# Patient Record
Sex: Female | Born: 1955 | Hispanic: Yes | Smoking: Never smoker
Health system: Southern US, Community
[De-identification: ages and names within clinical notes are randomized; demographics above are authoritative.]

## PROBLEM LIST (undated history)

## (undated) DIAGNOSIS — M797 Fibromyalgia: Secondary | ICD-10-CM

## (undated) DIAGNOSIS — T884XXA Failed or difficult intubation, initial encounter: Secondary | ICD-10-CM

## (undated) DIAGNOSIS — E785 Hyperlipidemia, unspecified: Secondary | ICD-10-CM

## (undated) DIAGNOSIS — K219 Gastro-esophageal reflux disease without esophagitis: Secondary | ICD-10-CM

## (undated) DIAGNOSIS — M1990 Unspecified osteoarthritis, unspecified site: Secondary | ICD-10-CM

## (undated) DIAGNOSIS — D179 Benign lipomatous neoplasm, unspecified: Secondary | ICD-10-CM

## (undated) DIAGNOSIS — I639 Cerebral infarction, unspecified: Secondary | ICD-10-CM

## (undated) DIAGNOSIS — J45909 Unspecified asthma, uncomplicated: Secondary | ICD-10-CM

## (undated) DIAGNOSIS — I10 Essential (primary) hypertension: Secondary | ICD-10-CM

## (undated) HISTORY — PX: ABDOMINAL HYSTERECTOMY: SHX81

## (undated) HISTORY — PX: BREAST EXCISIONAL BIOPSY: SUR124

---

## 2011-04-26 HISTORY — PX: BREAST EXCISIONAL BIOPSY: SUR124

## 2011-04-26 HISTORY — PX: BREAST BIOPSY: SHX20

## 2015-01-18 ENCOUNTER — Encounter (HOSPITAL_BASED_OUTPATIENT_CLINIC_OR_DEPARTMENT_OTHER): Payer: Self-pay | Admitting: *Deleted

## 2015-01-18 ENCOUNTER — Emergency Department (HOSPITAL_BASED_OUTPATIENT_CLINIC_OR_DEPARTMENT_OTHER)
Admission: EM | Admit: 2015-01-18 | Discharge: 2015-01-18 | Disposition: A | Discharge status: Home / Self Care | Payer: Medicare HMO | Attending: Emergency Medicine | Admitting: Emergency Medicine

## 2015-01-18 DIAGNOSIS — Z880 Allergy status to penicillin: Secondary | ICD-10-CM | POA: Diagnosis not present

## 2015-01-18 DIAGNOSIS — X58XXXA Exposure to other specified factors, initial encounter: Secondary | ICD-10-CM | POA: Diagnosis not present

## 2015-01-18 DIAGNOSIS — Y9289 Other specified places as the place of occurrence of the external cause: Secondary | ICD-10-CM | POA: Diagnosis not present

## 2015-01-18 DIAGNOSIS — H9202 Otalgia, left ear: Secondary | ICD-10-CM | POA: Diagnosis not present

## 2015-01-18 DIAGNOSIS — Y9389 Activity, other specified: Secondary | ICD-10-CM | POA: Diagnosis not present

## 2015-01-18 DIAGNOSIS — T162XXA Foreign body in left ear, initial encounter: Secondary | ICD-10-CM | POA: Diagnosis present

## 2015-01-18 DIAGNOSIS — Y998 Other external cause status: Secondary | ICD-10-CM | POA: Insufficient documentation

## 2015-01-18 HISTORY — DX: Fibromyalgia: M79.7

## 2015-01-18 MED ORDER — CIPROFLOXACIN-DEXAMETHASONE 0.3-0.1 % OT SUSP: 4.0000 [drp] | Freq: Two times a day (BID) | OTIC | Status: DC

## 2015-01-18 NOTE — ED Notes (Signed)
EDPA in room, at [facility]. Pt seen by EDPA prior to RN assessment, see PA notes, orders received to d/c. Care assumed at time of d/c. Report received from [facility], RN.

## 2015-01-18 NOTE — ED Notes (Signed)
?  cotton in left ear x 1 day

## 2015-01-18 NOTE — Discharge Instructions (Signed)
Please read and follow all provided instructions.  Your diagnoses today include:  1. Ear pain, left     Tests performed today include:  Vital signs. See below for your results today.   Medications prescribed:  Ciprofloxacin - antibiotic  You have been prescribed an antibiotic medicine for the ear.  Home care instructions:  Follow any educational materials contained in this packet.  Follow-up instructions: Please follow-up with your primary care provider as needed for further evaluation of your symptoms.  Return instructions:   Please return to the Emergency Department if you experience worsening symptoms.   Please return if you have any other emergent concerns.  Additional Information:  Your vital signs today were: BP 124/71 mmHg   Pulse 59   Temp(Src) 97.6 F (36.4 C) (Oral)   Resp 18   Ht  (1.6 m)   Wt 142 lb (64.411 kg)   BMI 25.16 kg/m2   SpO2 100% If your blood pressure (BP) was elevated above 135/85 this visit, please have this repeated by your doctor within one month. --------------- Otalgia (Otalgia) Usted o su nio sienten dolor en el odo. La causa ms frecuente es la infeccin del odo medio. El dolor aparece debido a la acumulacin de lquido y a la presin detrs del tmpano. El dolor puede ser agudo, sordo o intenso. Puede ser transitorio o constante. El odo medio est conectado a los conductos nasales por un corto tubo denominado trompa de [place]. Las trompas de [employer] permiten que el lquido drene [place] afuera del odo medio y [place] a Pharmacologist equilibrada la presin del odo . CAUSAS Un resfro o alergia pueden bloquear las trompas de [place] debido a la inflamacin y a Haematologist de Administrator. Esto es especialmente probable en los nios pequeos debido a que sus trompas son ms cortas y se Ambulance person posicin ms horizontal. Cuando las trompas de [place] se obstruyen, se detiene el flujo normal de lquido que proviene del odo. El  lquido se acumula y causa rigidez, Engineer, mining, prdida [place] e infeccin si desarrollan grmenes. SNTOMAS Los sntomas de infeccin en el odo son [place] Ruts, dolor, nerviosismo, aumento del llanto e irritabilidad. Muchos nios sufrirn una prdida [place] menor y temporaria durante la infeccin e inmediatamente despus. La prdida [employer] no es frecuente pero el riesgo aumenta si el nio sufre muchas infecciones. Otra de las causas es la retencin de agua en el canal auditivo externo debido a la natacin o al bao. En los adultos es menos probable que la causa del dolor sea una infeccin. El dolor de odos puede provenir de otras zonas. En algunos casos existe un problema en la articulacin que se encuentra entre la [place] y el crneo. Tambin puede provenir de los dientes o el cuello. Otras causas son:  Cuerpo extrao en el odo.  Infeccin en el odo externo.  Sinusitis.  Tapn de cera.  Traumatismo.  Artritis de Architectural technologist o trastornos de la articulacin temporomandibular.  Infeccin en el odo medio  Infecciones dentales.  Dolor de garganta con dolor de odos. DIAGNSTICO Generalmente el profesional realiza el diagnstico a travs de Nurse, learning disability. En algunos casos ser necesario realizar estudios especiales, como radiografas o [place]. TRATAMIENTO  Si le han prescripto antibiticos, selos segn las indicaciones y tmelos hasta completarlos an si los sntomas parecen haber mejorado.  En algunos nios ser necesario colocar tubos de ecualizacin de presin. stos tubos son pequeos conductos plsticos que se colocan en el tmpano en un procedimiento quirrgico simple.  Ellos permiten que el lquido drene ms fcilmente y Regulatory affairs officer presin del odo medio. Como consecuencia se Printmaker dolor ocasionado por los cambios de presin. INSTRUCCIONES PARA EL CUIDADO DOMICILIARIO  Utilice los medicamentos de venta libre o de prescripcin para Chief Technology Officer, el malestar o la  [place], segn se lo indique el profesional que lo asiste. NO LE ADMINISTRE ASPIRINA A SU NIO porque existe el riesgo de contraer el [place].  Aplique compresas frias en el odo externo durante 15 a 20 minutos, 3 a 4 veces por da, o segn lo necesite para Engineer, materials. No aplique hielo directamente sobre la piel. Puede congelar la piel.  Las gotas de venta libre utilizadas segn las indicaciones pueden ser efectivas. En algunos Nurse, adult.  Descansar en posicin erguida puede ayudar a reducir la presin en el odo medio y a Engineer, materials.  El dolor de odos causado por un rpido descenso desde altitudes elevadas puede aliviarse tragando o [employer]. Haga que el beb succione la mamadera durante los viajes en avin.  No fume dentro de la casa o cerca de los nios. Si no puede abandonar el hbito, fume en el exterior.  Controle las [place]. SOLICITE ATENCIN MDICA DE INMEDIATO SI:  Usted o el nio se sienten enfermos.  No consigue Engineer, materials con la medicacin.  Sus sntomas o los del nio (dolor, fiebre o irritabilidad) no mejoran dentro de 24 a 48 hs o segn las indicaciones.  Siente un dolor intenso y se detiene bruscamente. Esto puede indicar la ruptura del tmpano.  Usted o el nio desarrollan nuevos problemas, como dolor de cabeza intenso, rigidez en el cuello, dificultad para tragar o hinchazn del rostro o en la zona que rodea el odo. Document Released: 07/19/2007 Document Revised: 07/04/2011 [facility] Patient Information 2015 [place], [place]. This information is not intended to replace advice given to you by your health care provider. Make sure you discuss any questions you have with your health care provider.

## 2015-01-18 NOTE — ED Provider Notes (Signed)
CSN: [id number]     Arrival date & time 01/18/15  1826 History   First MD Initiated Contact with Patient 01/18/15 1833     Chief Complaint  Patient presents with  . Foreign Body in Ear     (Consider location/radiation/quality/duration/timing/severity/associated sxs/prior Treatment) HPI Comments: Patient presents with complaint of left ear irritation for the past 1 day. Patient was using Q-tips to clean her ear. She does this up to twice a day. She is concerned that she has a foreign body in her here due to the sensation of fullness. No hearing change. No discharge from the ear. No fevers, swelling or pain around the ear. She denies other injury. Onset acute. Course is constant.  Patient is a 59 y.o. female presenting with foreign body in ear. The history is provided by the patient.  Foreign Body in Ear Pertinent negatives include no abdominal pain, chills, congestion, coughing, fatigue, fever, headaches, myalgias, nausea, rash, sore throat or vomiting.    Past Medical History  Diagnosis Date  . Fibromyalgia    Past Surgical History  Procedure Laterality Date  . Abdominal hysterectomy     No family history on file. Social History  Substance Use Topics  . Smoking status: Never Smoker   . Smokeless tobacco: Never Used  . Alcohol Use: No   OB History    No data available     Review of Systems  Constitutional: Negative for fever, chills and fatigue.  HENT: Positive for ear pain. Negative for congestion, ear discharge, rhinorrhea, sinus pressure and sore throat.   Eyes: Negative for redness.  Respiratory: Negative for cough and wheezing.   Gastrointestinal: Negative for nausea, vomiting, abdominal pain and diarrhea.  Genitourinary: Negative for dysuria.  Musculoskeletal: Negative for myalgias and neck stiffness.  Skin: Negative for rash.  Neurological: Negative for headaches.  Hematological: Negative for adenopathy.      Allergies  Amoxicillin  Home Medications    Prior to Admission medications   Medication Sig Start Date End Date Taking? Authorizing Provider  ciprofloxacin-dexamethasone (CIPRODEX) otic suspension Place 4 drops into the left ear 2 (two) times daily. 01/18/15   Renne Crigler, PA-C   BP 124/71 mmHg  Pulse 59  Temp(Src) 97.6 F (36.4 C) (Oral)  Resp 18  Ht  (1.6 m)  Wt 142 lb (64.411 kg)  BMI 25.16 kg/m2  SpO2 100% Physical Exam  Constitutional: She appears well-developed and well-nourished.  HENT:  Head: Normocephalic and atraumatic.  Right Ear: Tympanic membrane and ear canal normal. No drainage, swelling or tenderness. No foreign bodies. Tympanic membrane is not injected, not scarred, not perforated, not erythematous, not retracted and not bulging. No middle ear effusion. No decreased hearing is noted.  Left Ear: Tympanic membrane normal. There is tenderness (mild with insertion of speculum). No drainage or swelling. No foreign bodies. Tympanic membrane is not injected, not scarred, not perforated, not erythematous, not retracted and not bulging.  No middle ear effusion. No decreased hearing is noted.  Nose: Nose normal. No mucosal edema or rhinorrhea.  Mouth/Throat: Uvula is midline, oropharynx is clear and moist and mucous membranes are normal. Mucous membranes are not dry. No oral lesions. No trismus in the jaw. No uvula swelling. No oropharyngeal exudate, posterior oropharyngeal edema, posterior oropharyngeal erythema or tonsillar abscesses.  Eyes: Conjunctivae are normal. Right eye exhibits no discharge. Left eye exhibits no discharge.  Neck: Normal range of motion. Neck supple.  Cardiovascular: Normal rate, regular rhythm and normal heart sounds.   Pulmonary/Chest:  Effort normal and breath sounds normal. No respiratory distress. She has no wheezes. She has no rales.  Abdominal: Soft. There is no tenderness.  Lymphadenopathy:    She has no cervical adenopathy.  Neurological: She is alert.  Skin: Skin is warm and dry.   Psychiatric: She has a normal mood and affect.  Nursing note and vitals reviewed.   ED Course  Procedures (including critical care time) Labs Review Labs Reviewed - No data to display  Imaging Review No results found. I have personally reviewed and evaluated these images and lab results as part of my medical decision-making.   EKG Interpretation None       Patient seen and examined.    Vital signs reviewed and are as follows: BP 124/71 mmHg  Pulse 59  Temp(Src) 97.6 F (36.4 C) (Oral)  Resp 18  Ht  (1.6 m)  Wt 142 lb (64.411 kg)  BMI 25.16 kg/m2  SpO2 100%  Due to the inflamed ear canal, patient given Ciprodex drops to use. Reassure that there is no foreign body. Counseled to avoid placing anything into the ear canal, including Q-tips.  MDM   Final diagnoses:  Ear pain, left   Patient here with ear pain, tender mildly inflamed ear canal. Concern for possible otitis externa. Patient treated as above. No concern for malignant otitis. No TM perforation. Patient well-appearing.   Renne Crigler, PA-C 01/19/15 0000  Benjiman Core, MD 01/19/15 [phone]

## 2015-01-18 NOTE — ED Notes (Signed)
[employer] interpreter "Gerilyn Pilgrim" used for assessment, questions, d/c instructions.

## 2018-05-10 ENCOUNTER — Other Ambulatory Visit: Payer: Self-pay | Admitting: Physician Assistant

## 2018-05-10 DIAGNOSIS — Z1231 Encounter for screening mammogram for malignant neoplasm of breast: Secondary | ICD-10-CM

## 2018-06-06 ENCOUNTER — Ambulatory Visit
Admission: RE | Admit: 2018-06-06 | Discharge: 2018-06-06 | Disposition: A | Discharge status: Home / Self Care | Payer: Medicare Other | Source: Ambulatory Visit | Attending: Physician Assistant | Admitting: Physician Assistant

## 2018-06-06 DIAGNOSIS — Z1231 Encounter for screening mammogram for malignant neoplasm of breast: Secondary | ICD-10-CM | POA: Diagnosis not present

## 2018-06-12 ENCOUNTER — Other Ambulatory Visit: Payer: Self-pay | Admitting: Physician Assistant

## 2018-06-12 DIAGNOSIS — N6489 Other specified disorders of breast: Secondary | ICD-10-CM

## 2018-06-12 DIAGNOSIS — R928 Other abnormal and inconclusive findings on diagnostic imaging of breast: Secondary | ICD-10-CM

## 2018-06-14 ENCOUNTER — Other Ambulatory Visit: Payer: Medicare Other

## 2018-06-14 ENCOUNTER — Ambulatory Visit: Payer: Medicare Other

## 2018-06-19 ENCOUNTER — Other Ambulatory Visit: Payer: Medicare Other

## 2018-06-19 ENCOUNTER — Ambulatory Visit: Payer: Medicare Other

## 2018-06-20 ENCOUNTER — Ambulatory Visit
Admission: RE | Admit: 2018-06-20 | Discharge: 2018-06-20 | Disposition: A | Discharge status: Home / Self Care | Payer: Medicare Other | Source: Ambulatory Visit | Attending: Physician Assistant | Admitting: Physician Assistant

## 2018-06-20 DIAGNOSIS — N6489 Other specified disorders of breast: Secondary | ICD-10-CM | POA: Diagnosis present

## 2018-06-20 DIAGNOSIS — R928 Other abnormal and inconclusive findings on diagnostic imaging of breast: Secondary | ICD-10-CM

## 2018-06-26 ENCOUNTER — Other Ambulatory Visit: Payer: Self-pay | Admitting: Physician Assistant

## 2018-06-26 DIAGNOSIS — N6002 Solitary cyst of left breast: Secondary | ICD-10-CM

## 2019-03-19 ENCOUNTER — Ambulatory Visit
Admission: RE | Admit: 2019-03-19 | Discharge: 2019-03-19 | Disposition: A | Discharge status: Home / Self Care | Payer: Medicare Other | Source: Ambulatory Visit | Attending: Physician Assistant | Admitting: Physician Assistant

## 2019-03-19 DIAGNOSIS — N6002 Solitary cyst of left breast: Secondary | ICD-10-CM | POA: Insufficient documentation

## 2019-06-17 ENCOUNTER — Other Ambulatory Visit: Payer: Self-pay | Admitting: Physician Assistant

## 2019-06-17 DIAGNOSIS — R928 Other abnormal and inconclusive findings on diagnostic imaging of breast: Secondary | ICD-10-CM

## 2019-06-18 ENCOUNTER — Other Ambulatory Visit: Payer: Self-pay | Admitting: Internal Medicine

## 2019-06-18 DIAGNOSIS — N6320 Unspecified lump in the left breast, unspecified quadrant: Secondary | ICD-10-CM

## 2019-07-03 ENCOUNTER — Ambulatory Visit
Admission: RE | Admit: 2019-07-03 | Discharge: 2019-07-03 | Disposition: A | Discharge status: Home / Self Care | Payer: Medicare HMO | Source: Ambulatory Visit | Attending: Internal Medicine | Admitting: Internal Medicine

## 2019-07-03 DIAGNOSIS — N6320 Unspecified lump in the left breast, unspecified quadrant: Secondary | ICD-10-CM | POA: Diagnosis present

## 2019-07-03 DIAGNOSIS — N6321 Unspecified lump in the left breast, upper outer quadrant: Secondary | ICD-10-CM | POA: Diagnosis not present

## 2019-07-19 ENCOUNTER — Ambulatory Visit: Payer: Medicare HMO

## 2019-07-31 ENCOUNTER — Ambulatory Visit: Payer: Medicare HMO | Attending: Internal Medicine

## 2019-11-07 IMAGING — MG DIGITAL DIAGNOSTIC UNILATERAL LEFT MAMMOGRAM WITH TOMO AND CAD
6 series · 6 of 18 positions shown · non-contrast
Comparison: Previous exam(s).

CLINICAL DATA: Patient was called back from screening mammogram for
a possible asymmetry in the left breast.

EXAM:
DIGITAL DIAGNOSTIC LEFT MAMMOGRAM WITH CAD AND TOMO
ULTRASOUND LEFT BREAST

[L MLO synth-2D (1 of 2)]
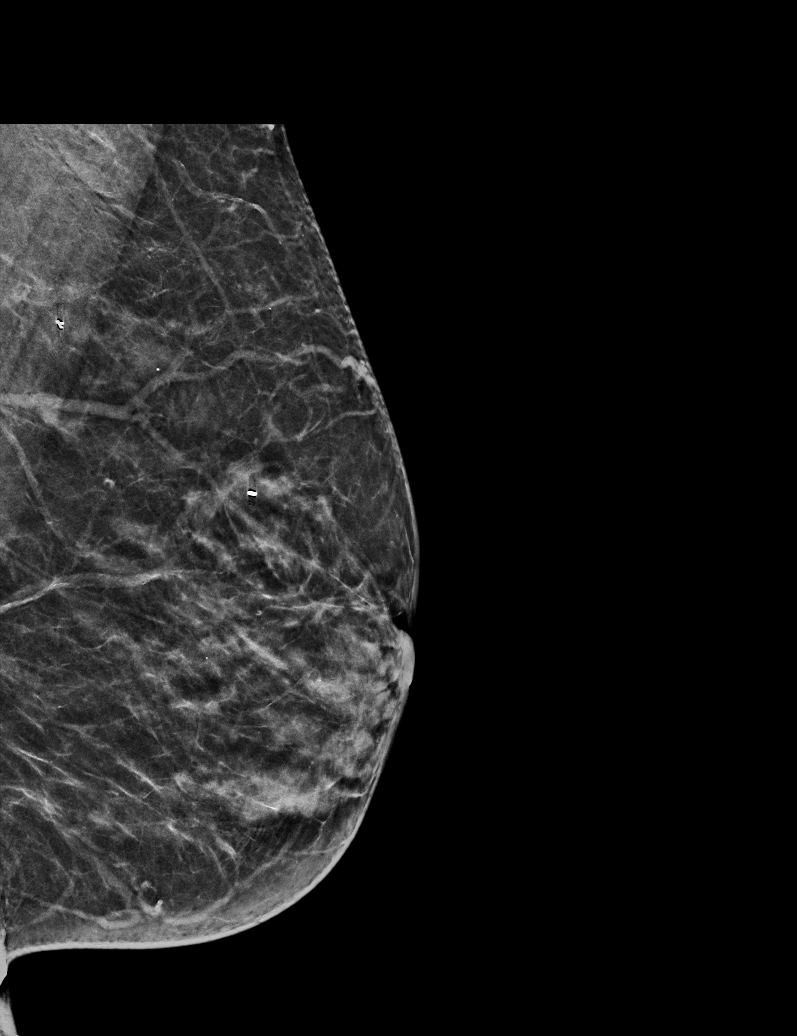

[L MLO synth-2D (2 of 2)]
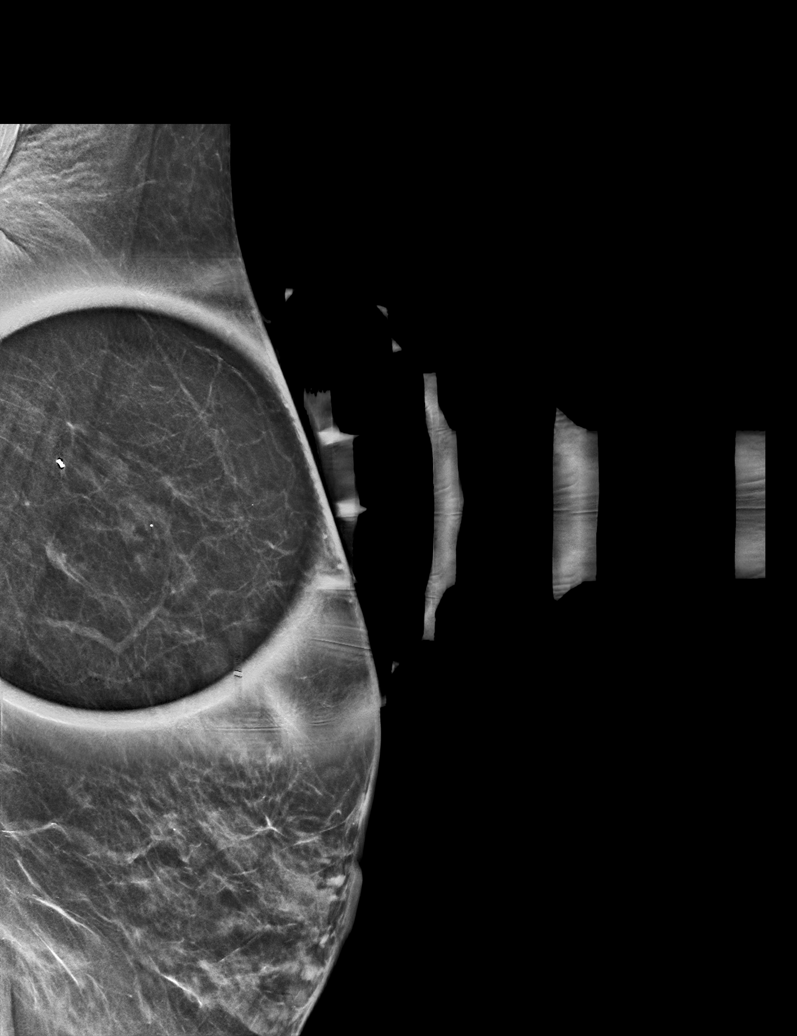

[L ML synth-2D]
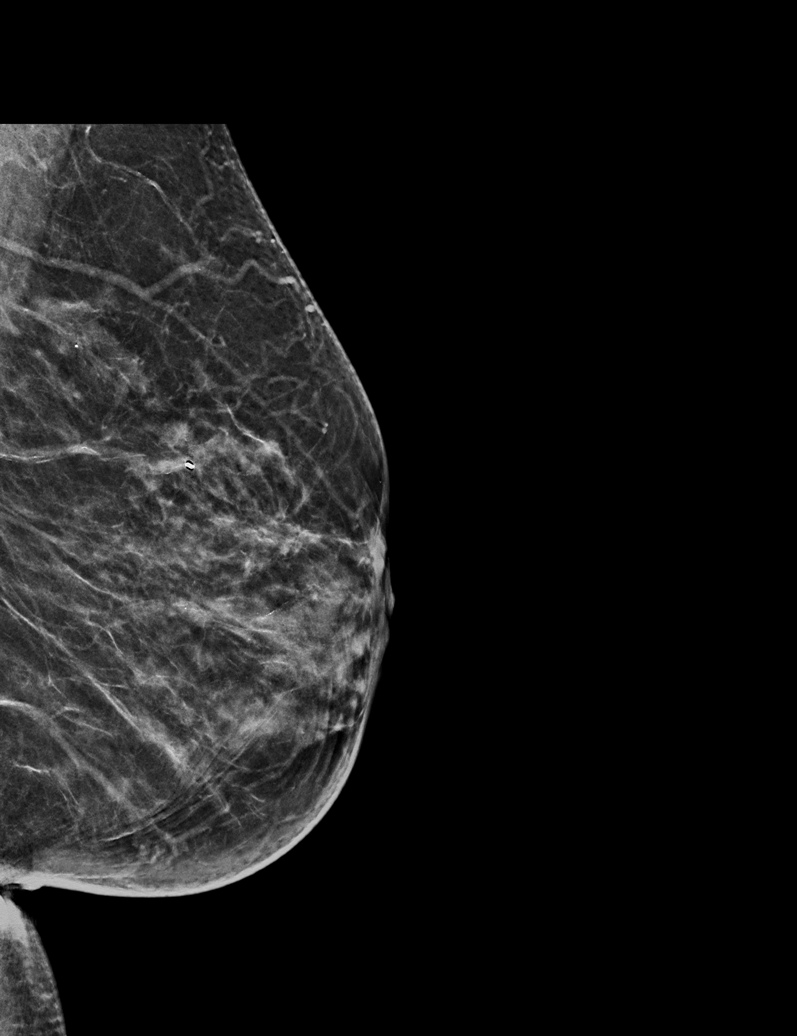

[L ML tomo · tomo slice 29/58.0]
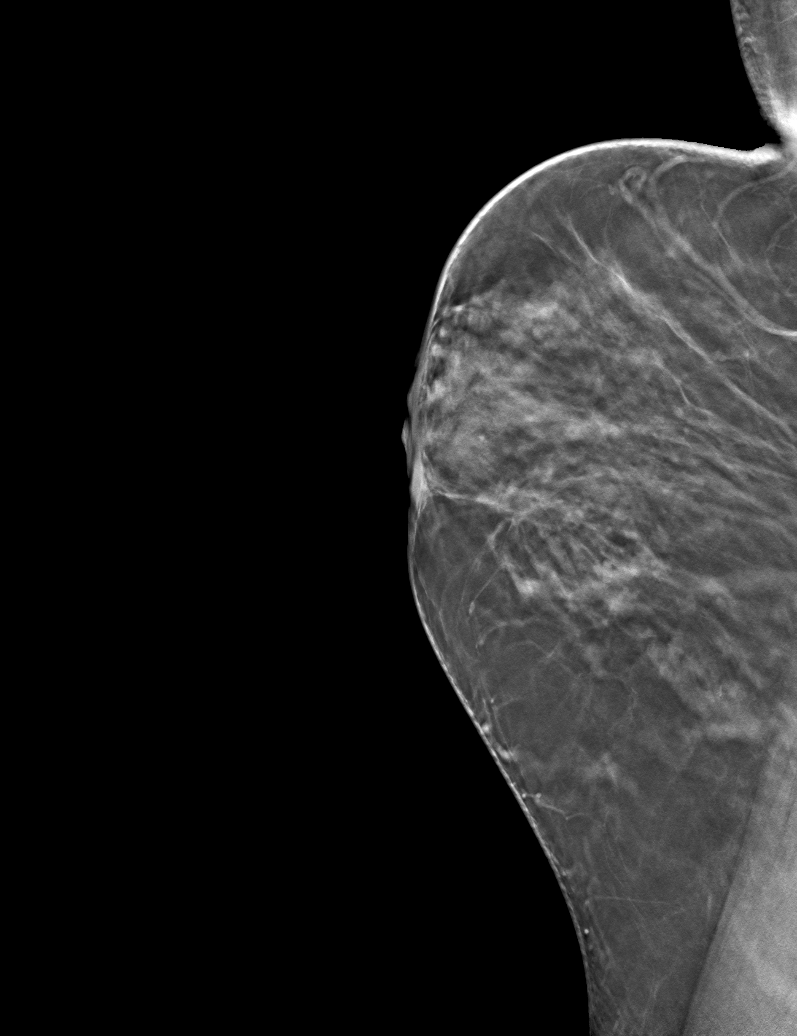

[L MLO tomo (1 of 2) · tomo slice 27/53.0]
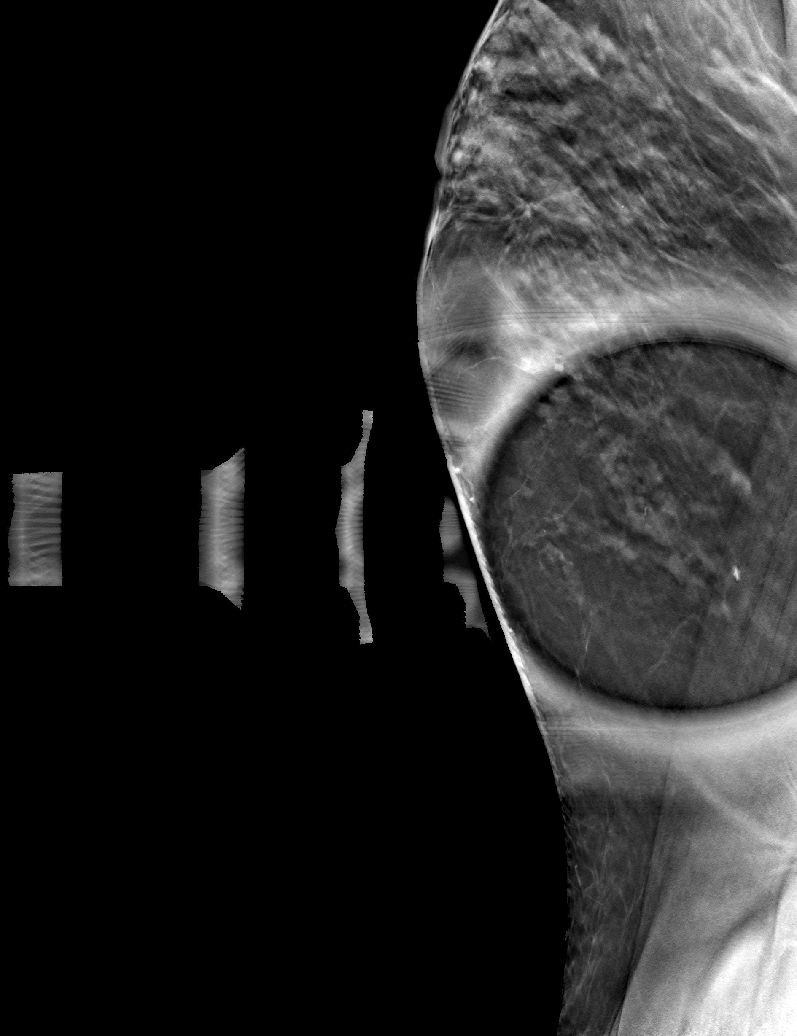

[L MLO tomo (2 of 2) · tomo slice 27/54.0]
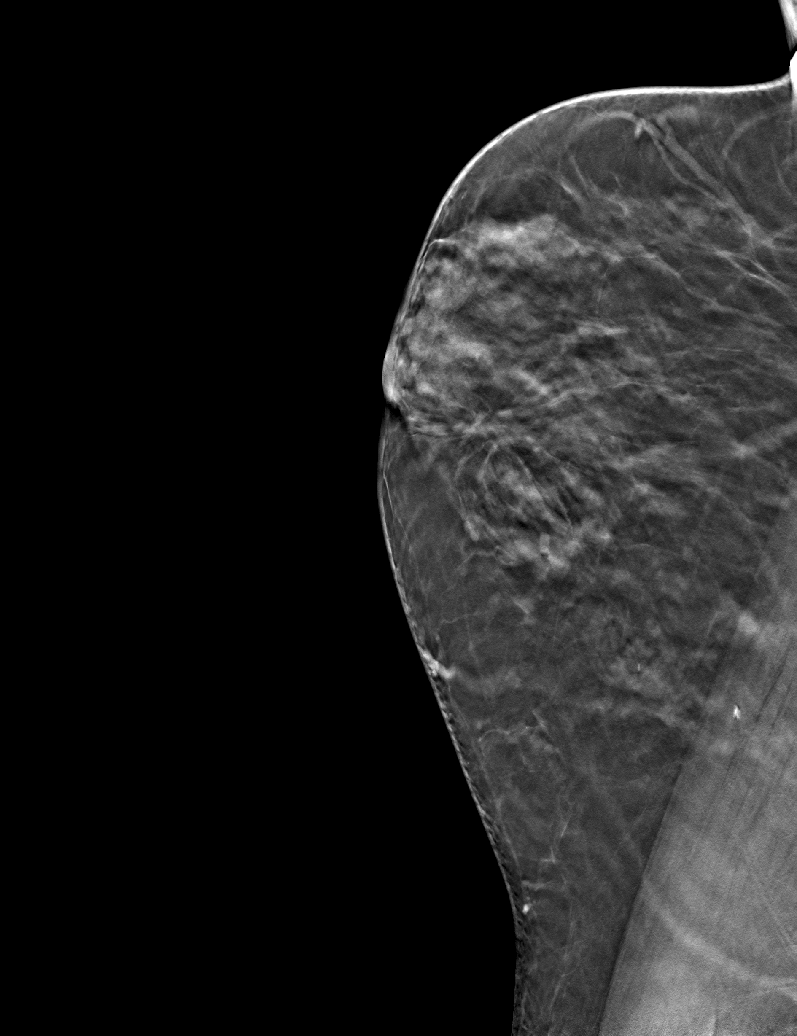

[6 of 18 positions shown; findings below may reference images not displayed]

ACR Breast Density Category c: The breast tissue is heterogeneously
dense, which may obscure small masses.
FINDINGS: Additional imaging of the left breast was performed. There is
persistence of a 7 mm mass in the posterior third of the upper outer
quadrant of the left breast.

Mammographic images were processed with CAD.

On physical exam, no mass is palpated in the upper-outer quadrant of
the left breast.

Targeted ultrasound is performed, showing probable benign cluster of
cysts (apocrine metaplasia) in the left breast at [DATE] 9 cm from the
nipple measuring 7 x 3 x 6 mm.
IMPRESSION: Probable benign cysts (apocrine metaplasia) in left breast at [DATE] 9
cm from the nipple.

RECOMMENDATION:
Short-term interval follow-up left breast ultrasound 6 months is
recommended.

I have discussed the findings and recommendations with the patient.
Results were also provided in writing at the conclusion of the
visit. If applicable, a reminder letter will be sent to the patient
regarding the next appointment.

BI-RADS CATEGORY  3: Probably benign.

## 2020-07-04 ENCOUNTER — Emergency Department
Admission: EM | Admit: 2020-07-04 | Discharge: 2020-07-04 | Disposition: A | Discharge status: Home / Self Care | Payer: Medicare HMO | Attending: Emergency Medicine | Admitting: Emergency Medicine

## 2020-07-04 ENCOUNTER — Other Ambulatory Visit: Payer: Self-pay

## 2020-07-04 DIAGNOSIS — I10 Essential (primary) hypertension: Secondary | ICD-10-CM | POA: Diagnosis not present

## 2020-07-04 DIAGNOSIS — R519 Headache, unspecified: Secondary | ICD-10-CM | POA: Diagnosis present

## 2020-07-04 HISTORY — DX: Essential (primary) hypertension: I10

## 2020-07-04 LAB — CBC WITH DIFFERENTIAL/PLATELET
Abs Immature Granulocytes: 0.01 10*3/uL (ref 0.00–0.07)
Basophils Absolute: 0 10*3/uL (ref 0.0–0.1)
Basophils Relative: 0 %
Eosinophils Absolute: 0.1 10*3/uL (ref 0.0–0.5)
Eosinophils Relative: 3 %
HCT: 44.8 % (ref 36.0–46.0)
Hemoglobin: 14 g/dL (ref 12.0–15.0)
Immature Granulocytes: 0 %
Lymphocytes Relative: 51 %
Lymphs Abs: 2.3 10*3/uL (ref 0.7–4.0)
MCH: 25.9 pg — ABNORMAL LOW (ref 26.0–34.0)
MCHC: 31.3 g/dL (ref 30.0–36.0)
MCV: 83 fL (ref 80.0–100.0)
Monocytes Absolute: 0.3 10*3/uL (ref 0.1–1.0)
Monocytes Relative: 7 %
Neutro Abs: 1.8 10*3/uL (ref 1.7–7.7)
Neutrophils Relative %: 39 %
Platelets: 215 10*3/uL (ref 150–400)
RBC: 5.4 MIL/uL — ABNORMAL HIGH (ref 3.87–5.11)
RDW: 13.4 % (ref 11.5–15.5)
WBC: 4.5 10*3/uL (ref 4.0–10.5)
nRBC: 0 % (ref 0.0–0.2)

## 2020-07-04 LAB — COMPREHENSIVE METABOLIC PANEL
ALT: 44 U/L (ref 0–44)
AST: 31 U/L (ref 15–41)
Albumin: 3.9 g/dL (ref 3.5–5.0)
Alkaline Phosphatase: 99 U/L (ref 38–126)
Anion gap: 7 (ref 5–15)
BUN: 10 mg/dL (ref 8–23)
CO2: 26 mmol/L (ref 22–32)
Calcium: 9.4 mg/dL (ref 8.9–10.3)
Chloride: 104 mmol/L (ref 98–111)
Creatinine, Ser: 0.73 mg/dL (ref 0.44–1.00)
GFR, Estimated: >60 mL/min (ref >60)
Glucose, Bld: 145 mg/dL — ABNORMAL HIGH (ref 70–99)
Potassium: 4.4 mmol/L (ref 3.5–5.1)
Sodium: 137 mmol/L (ref 135–145)
Total Bilirubin: 0.6 mg/dL (ref 0.3–1.2)
Total Protein: 6.6 g/dL (ref 6.5–8.1)

## 2020-07-04 LAB — TROPONIN I (HIGH SENSITIVITY): Troponin I (High Sensitivity): 3 ng/L (ref <18)

## 2020-07-04 NOTE — Discharge Instructions (Addendum)
Please seek medical attention for any high fevers, chest pain, shortness of breath, change in behavior, persistent vomiting, bloody stool or any other new or concerning symptoms.  

## 2020-07-04 NOTE — ED Triage Notes (Signed)
HTN at home, ongoing and compliant with medication. Called EMS tonight for evaluation as concerned medication not working. Denies chest pain or pressure as well as SOB. Reports intermittent h/a. Denies dizziness or vision change. Pt in NAD in triage.

## 2020-07-04 NOTE — ED Provider Notes (Signed)
[facility] Emergency Department Provider Note   ____________________________________________   I have reviewed the triage vital signs and the nursing notes.   HISTORY  Chief Complaint Hypertension   History limited by: Language [facility] Interpreter utilized   HPI Mia Alvarez is a 65 y.o. female who presents to the emergency department today because of concerns for high blood pressure.  The patient states that she recently started receiving treatment for high blood pressure through her primary care doctor.  She has been on blood pressure medication for the past couple of weeks.  Today the patient started developing a headache.  Because of this she took her blood pressure and noted it to be elevated at 180/105.  She denies any chest pain or shortness of breath.    Records reviewed. Per medical record review patient has a history of HTN, recently started treatment.   Past Medical History:  Diagnosis Date  . Fibromyalgia   . Hypertension     There are no problems to display for this patient.   Past Surgical History:  Procedure Laterality Date  . ABDOMINAL HYSTERECTOMY    . BREAST BIOPSY Left 2013   neg   . BREAST EXCISIONAL BIOPSY Left 2013   neg    Prior to Admission medications   Medication Sig Start Date End Date Taking? Authorizing Provider  ciprofloxacin-dexamethasone (CIPRODEX) otic suspension Place 4 drops into the left ear 2 (two) times daily. 01/18/15   Renne Crigler, PA-C    Allergies Amoxicillin  History reviewed. No pertinent family history.  Social History Social History   Tobacco Use  . Smoking status: Never Smoker  . Smokeless tobacco: Never Used  Substance Use Topics  . Alcohol use: No  . Drug use: No    Review of Systems Constitutional: No fever/chills Eyes: No visual changes. ENT: No sore throat. Cardiovascular: Denies chest pain. Respiratory: Denies shortness of breath. Gastrointestinal: No  abdominal pain.  No nausea, no vomiting.  No diarrhea.   Genitourinary: Negative for dysuria. Musculoskeletal: Negative for back pain. Skin: Negative for rash. Neurological: Positive for headache. ____________________________________________   PHYSICAL EXAM:  VITAL SIGNS: ED Triage Vitals [07/04/20 2046]  Enc Vitals Group     BP (!) 143/91     Pulse Rate 79     Resp 18     Temp (!) 97.5 F (36.4 C)     Temp Source Oral     SpO2 98 %     Weight 162 lb (73.5 kg)     Height 5\' 2"  (1.575 m)     Head Circumference      Peak Flow      Pain Score 0   Constitutional: Alert and oriented.  Eyes: Conjunctivae are normal.  ENT      Head: Normocephalic and atraumatic.      Nose: No congestion/rhinnorhea.      Mouth/Throat: Mucous membranes are moist.      Neck: No stridor. Hematological/Lymphatic/Immunilogical: No cervical lymphadenopathy. Cardiovascular: Normal rate, regular rhythm.  No murmurs, rubs, or gallops.  Respiratory: Normal respiratory effort without tachypnea nor retractions. Breath sounds are clear and equal bilaterally. No wheezes/rales/rhonchi. Gastrointestinal: Soft and non tender. No rebound. No guarding.  Genitourinary: Deferred Musculoskeletal: Normal range of motion in all extremities. No lower extremity edema. Neurologic:  Normal speech and language. No gross focal neurologic deficits are appreciated.  Skin:  Skin is warm, dry and intact. No rash noted. Psychiatric: Mood and affect are normal. Speech and behavior  are normal. Patient exhibits appropriate insight and judgment.  ____________________________________________    LABS (pertinent positives/negatives)  CBC wbc 4.5, hgb 14.0, plt 215 Trop hs 3 CMP wnl except glu 145 ____________________________________________   EKG  I, Phineas Semen, attending physician, personally viewed and interpreted this EKG  EKG Time: 2107 Rate: 71 Rhythm: sinus rhythm Axis: normal Intervals: qtc 430 QRS:  narrow ST changes: no st elevation Impression: abnormal ekg  ____________________________________________    RADIOLOGY  None  ____________________________________________   PROCEDURES  Procedures  ____________________________________________   INITIAL IMPRESSION / ASSESSMENT AND PLAN / ED COURSE  Pertinent labs & imaging results that were available during my care of the patient were reviewed by me and considered in my medical decision making (see chart for details).   Patient presented to the emergency department today because of concerns for headache and elevated blood pressure.  On exam patient no acute distress.  Blood pressure improved on its own.  Blood work without any signs concerning for kidney or heart damage.  I did discussion about blood pressure with the patient.  At this time do not feel any acute concern with her blood pressure.  Will plan on discharging to follow-up with primary care.  ____________________________________________   FINAL CLINICAL IMPRESSION(S) / ED DIAGNOSES  Final diagnoses:  Hypertension, unspecified type     Note: This dictation was prepared with Dragon dictation. Any transcriptional errors that result from this process are unintentional     Phineas Semen, MD 07/04/20 2340

## 2021-02-08 ENCOUNTER — Emergency Department: Payer: Medicare HMO

## 2021-02-08 ENCOUNTER — Emergency Department
Admission: EM | Admit: 2021-02-08 | Discharge: 2021-02-08 | Disposition: A | Discharge status: Home / Self Care | Payer: Medicare HMO | Attending: Emergency Medicine | Admitting: Emergency Medicine

## 2021-02-08 ENCOUNTER — Other Ambulatory Visit: Payer: Self-pay

## 2021-02-08 DIAGNOSIS — Z79899 Other long term (current) drug therapy: Secondary | ICD-10-CM | POA: Diagnosis not present

## 2021-02-08 DIAGNOSIS — J40 Bronchitis, not specified as acute or chronic: Secondary | ICD-10-CM | POA: Diagnosis not present

## 2021-02-08 DIAGNOSIS — Z20822 Contact with and (suspected) exposure to covid-19: Secondary | ICD-10-CM | POA: Insufficient documentation

## 2021-02-08 DIAGNOSIS — Z7951 Long term (current) use of inhaled steroids: Secondary | ICD-10-CM | POA: Diagnosis not present

## 2021-02-08 DIAGNOSIS — I10 Essential (primary) hypertension: Secondary | ICD-10-CM | POA: Insufficient documentation

## 2021-02-08 DIAGNOSIS — J45909 Unspecified asthma, uncomplicated: Secondary | ICD-10-CM | POA: Diagnosis not present

## 2021-02-08 DIAGNOSIS — R0602 Shortness of breath: Secondary | ICD-10-CM | POA: Diagnosis present

## 2021-02-08 LAB — RESP PANEL BY RT-PCR (FLU A&B, COVID) ARPGX2
Influenza A by PCR: NEGATIVE
Influenza B by PCR: NEGATIVE
SARS Coronavirus 2 by RT PCR: NEGATIVE

## 2021-02-08 MED ORDER — BENZONATATE 100 MG PO CAPS: 100.0000 mg | ORAL_CAPSULE | Freq: Four times a day (QID) | ORAL | 0 refills | Status: AC | PRN

## 2021-02-08 NOTE — ED Triage Notes (Signed)
Through the spanish interpreter, Pt reports having asthma, and states everything she takes for it it raises her bp, pt states since Friday she has been coughing a lot and is having to breathe through her mouth

## 2021-02-08 NOTE — ED Provider Notes (Signed)
Emergency Medicine Provider Triage Evaluation Note  Mia Alvarez , a 65 y.o. female  was evaluated in triage.  Pt complains of worsening asthma x 5 days. She reports she has asthma and allergies. She was in the [place] and exposed to a furry dog and has had a runny nose and cough since. No relief with Allegra and Albuterol.  Review of Systems  Positive: Cough Negative: Fever  Physical Exam  BP 125/82 (BP Location: Left Arm)   Pulse 82   Temp 98.3 F (36.8 C) (Oral)   Resp 20   Ht 5\' 2"  (1.575 m)   Wt 72.6 kg   SpO2 97%   BMI 29.26 kg/m  Gen:   Awake, no distress   Resp:  Normal effort  MSK:   Moves extremities without difficulty  Other:    Medical Decision Making  Medically screening exam initiated at 11:48 AM.  Appropriate orders placed.  Mia Alvarez was informed that the remainder of the evaluation will be completed by another provider, this initial triage assessment does not replace that evaluation, and the importance of remaining in the ED until their evaluation is complete.   Marge Duncans, FNP 02/08/21 1150    02/10/21, MD 02/08/21 [phone]

## 2021-02-08 NOTE — ED Provider Notes (Signed)
To  [facility]  ____________________________________________   Event Date/Time   First MD Initiated Contact with Patient 02/08/21 1229     (approximate)  I have reviewed the triage vital signs and the nursing notes.   HISTORY  Chief Complaint No chief complaint on file.    HPI Angelic Schnelle is a 65 y.o. female past medical history of fibromyalgia hypertension, asthma who presents with cough and shortness of breath.  Symptoms started about 5 days ago.  Everything seems to have started after she was a dog, she notes that her allergies and asthma usually act up when exposed to animals.  She has had cough productive of clear and yellow sputum as well as dyspnea.  She denies significant chest pain.  No abdominal pain nausea vomiting.  No fevers or chills.  Patient has chronic cough secondary to allergies which is now worse.  Patient was recently in the [place], she flew back last week.  Flight was about 5 hours.  The patient denies hx of prior DVT/PE, unilateral leg pain/swelling, hormone use, recent surgery, hx of cancer, prolonged immobilization, or hemoptysis.  She uses her albuterol inhaler at home.          Past Medical History:  Diagnosis Date   Fibromyalgia    Hypertension     There are no problems to display for this patient.   Past Surgical History:  Procedure Laterality Date   ABDOMINAL HYSTERECTOMY     BREAST BIOPSY Left 2013   neg    BREAST EXCISIONAL BIOPSY Left 2013   neg    Prior to Admission medications   Medication Sig Start Date End Date Taking? Authorizing Provider  amLODipine (NORVASC) 2.5 MG tablet Take 2.5 mg by mouth daily.   Yes [provider]  budesonide-formoterol (SYMBICORT) 160-4.5 MCG/ACT inhaler Inhale 2 puffs into the lungs 2 (two) times daily.   Yes [provider]  DULoxetine (CYMBALTA) 30 MG capsule Take 30 mg by mouth daily.   Yes [provider]   ciprofloxacin-dexamethasone (CIPRODEX) otic suspension Place 4 drops into the left ear 2 (two) times daily. 01/18/15   Renne Crigler, PA-C    Allergies Amoxicillin and Codeine  No family history on file.  Social History Social History   Tobacco Use   Smoking status: Never   Smokeless tobacco: Never  Substance Use Topics   Alcohol use: No   Drug use: No    Review of Systems   Review of Systems  Constitutional:  Negative for chills, fatigue and fever.  Respiratory:  Positive for cough and shortness of breath. Negative for chest tightness.   Cardiovascular:  Negative for chest pain and leg swelling.  Gastrointestinal:  Negative for abdominal pain, nausea and vomiting.  All other systems reviewed and are negative.  Physical Exam Updated Vital Signs BP 128/85 (BP Location: Right Arm)   Pulse 72   Temp 98.3 F (36.8 C) (Oral)   Resp 15   Ht 5\' 2"  (1.575 m)   Wt 72.6 kg   SpO2 95%   BMI 29.26 kg/m   Physical Exam Vitals and nursing note reviewed.  Constitutional:      General: She is not in acute distress.    Appearance: Normal appearance.  HENT:     Head: Normocephalic and atraumatic.  Eyes:     General: No scleral icterus.    Conjunctiva/sclera: Conjunctivae normal.  Pulmonary:     Effort: Pulmonary effort is normal. No respiratory distress.  Breath sounds: No stridor.     Comments: Lungs are clear, no wheezing Abdominal:     General: Abdomen is flat.     Palpations: Abdomen is soft.  Musculoskeletal:        General: No deformity or signs of injury.     Cervical back: Normal range of motion.     Right lower leg: No edema.     Left lower leg: No edema.  Skin:    General: Skin is dry.     Coloration: Skin is not jaundiced or pale.  Neurological:     General: No focal deficit present.     Mental Status: She is alert and oriented to person, place, and time. Mental status is at baseline.  Psychiatric:        Mood and Affect: Mood normal.         Behavior: Behavior normal.     LABS (all labs ordered are listed, but only abnormal results are displayed)  Labs Reviewed  RESP PANEL BY RT-PCR (FLU A&B, COVID) [id number]   ____________________________________________  EKG  Normal sinus rhythm, normal axis, normal intervals, large T wave in aVF, no other acute ischemic changes ____________________________________________  RADIOLOGY Ky Barban, personally viewed and evaluated these images (plain radiographs) as part of my medical decision making, as well as reviewing the written report by the radiologist.  ED MD interpretation:  I reviewed the CXR which does not show any acute cardiopulmonary process      ____________________________________________   PROCEDURES  Procedure(s) performed (including Critical Care):  Procedures   ____________________________________________   INITIAL IMPRESSION / ASSESSMENT AND PLAN / ED COURSE     Patient is a 65 year old female presents with cough congestion and shortness of breath.  Vital signs within normal limits, she is not hypoxic or tachycardic.  She is appears very well on exam, lungs are clear.  Patient has history of asthma and allergies, symptoms seem to have started after she was exposed to a dog.  Suspect viral bronchitis.  COVID and influenza testing are negative.  Chest x-ray obtained which does not show any evidence of pneumonia.  Considered pulmonary embolism given her dyspnea clear lungs and the recent travel however patient is not tachycardic not hypoxic and I feel that bronchitis is more likely diagnosis the other constellation of symptoms she is having as well as the exposure.  Her EKG is nonischemic.  Will discharge.  We discussed return precautions.      ____________________________________________   FINAL CLINICAL IMPRESSION(S) / ED DIAGNOSES  Final diagnoses:  Bronchitis     ED Discharge Orders     None        Note:  This document was prepared  using Dragon voice recognition software and may include unintentional dictation errors.    Georga Hacking, MD 02/08/21 1339

## 2021-05-21 ENCOUNTER — Emergency Department: Payer: Medicare Other

## 2021-05-21 ENCOUNTER — Emergency Department
Admission: EM | Admit: 2021-05-21 | Discharge: 2021-05-21 | Disposition: A | Discharge status: Home / Self Care | Payer: Medicare Other | Attending: Emergency Medicine | Admitting: Emergency Medicine

## 2021-05-21 ENCOUNTER — Other Ambulatory Visit: Payer: Self-pay

## 2021-05-21 DIAGNOSIS — R519 Headache, unspecified: Secondary | ICD-10-CM | POA: Diagnosis not present

## 2021-05-21 DIAGNOSIS — Z79899 Other long term (current) drug therapy: Secondary | ICD-10-CM | POA: Insufficient documentation

## 2021-05-21 DIAGNOSIS — R0789 Other chest pain: Secondary | ICD-10-CM | POA: Insufficient documentation

## 2021-05-21 DIAGNOSIS — I10 Essential (primary) hypertension: Secondary | ICD-10-CM | POA: Insufficient documentation

## 2021-05-21 LAB — BASIC METABOLIC PANEL WITH GFR
Anion gap: 7 (ref 5–15)
BUN: 9 mg/dL (ref 8–23)
CO2: 28 mmol/L (ref 22–32)
Calcium: 9.2 mg/dL (ref 8.9–10.3)
Chloride: 105 mmol/L (ref 98–111)
Creatinine, Ser: 0.76 mg/dL (ref 0.44–1.00)
GFR, Estimated: >60 mL/min
Glucose, Bld: 122 mg/dL — ABNORMAL HIGH (ref 70–99)
Potassium: 4.4 mmol/L (ref 3.5–5.1)
Sodium: 140 mmol/L (ref 135–145)

## 2021-05-21 LAB — TROPONIN I (HIGH SENSITIVITY)
Troponin I (High Sensitivity): <2 ng/L (ref <18)
Troponin I (High Sensitivity): 3 ng/L

## 2021-05-21 LAB — CBC
HCT: 46.7 % — ABNORMAL HIGH (ref 36.0–46.0)
Hemoglobin: 14.5 g/dL (ref 12.0–15.0)
MCH: 26.4 pg (ref 26.0–34.0)
MCHC: 31 g/dL (ref 30.0–36.0)
MCV: 84.9 fL (ref 80.0–100.0)
Platelets: 237 10*3/uL (ref 150–400)
RBC: 5.5 MIL/uL — ABNORMAL HIGH (ref 3.87–5.11)
RDW: 13.5 % (ref 11.5–15.5)
WBC: 3.5 10*3/uL — ABNORMAL LOW (ref 4.0–10.5)
nRBC: 0 % (ref 0.0–0.2)

## 2021-05-21 MED ORDER — LORAZEPAM 2 MG/ML IJ SOLN
1.0000 mg | INTRAMUSCULAR | Status: DC | PRN
  Administered 2021-05-21: 1 mg via INTRAVENOUS
  Filled 2021-05-21: qty 1

## 2021-05-21 MED ORDER — LORAZEPAM 1 MG PO TABS: 1.0000 mg | ORAL_TABLET | ORAL | Status: DC | PRN

## 2021-05-21 NOTE — ED Notes (Signed)
Called MRI because pt was taken for MRI but RN had not received call to give ativan, so pt was brought back to ER room.  They will call back when it is time to give PO ativan for MRI.

## 2021-05-21 NOTE — ED Triage Notes (Signed)
Pt via POV from [facility]. Pt report R sided chest tightness, states her BP has been elevated. Pt report 174/106. Pt states she take BP M Pt states CP started today. Denies NVD. Denies fever. Denies SOB Pt is A&Ox4 and NAD.  Spanish interpreter needed

## 2021-05-21 NOTE — ED Provider Notes (Signed)
[facility] Provider Note    Event Date/Time   First MD Initiated Contact with Patient 05/21/21 1245     (approximate)   History   Chief Complaint Chest Pain   HPI  Mia Alvarez is a 66 y.o. female with past medical history of hypertension and fibromyalgia who presents to the ED complaining of chest pain.  History is limited as patient is Spanish-speaking only, history obtained with the assistance of in person interpreter.  Patient reports that for the past couple of days she has noticed that her blood pressure has been running high with readings at home as high as 170/104.  She had been feeling fine up until this morning, when she woke up with a right-sided headache and pressure in the right side of her chest.  This was also associated with a feeling of heaviness in her right arm, but she denies any numbness or weakness in the arm.  She has not had any recent fevers, cough, or shortness of breath.  She states that the pressure in her chest and headache along with the heavy feeling have gradually improved since she woke up this morning.  She denies any vision changes or speech changes.  She reports being compliant with her usual blood pressure medications, takes amlodipine and losartan, both of which she took earlier today.  She was initially evaluated at the [facility] walk-in clinic, subsequently referred to the ED for further evaluation.     Physical Exam   Triage Vital Signs: ED Triage Vitals  Enc Vitals Group     BP 05/21/21 1153 136/90     Pulse Rate 05/21/21 1153 81     Resp 05/21/21 1153 20     Temp 05/21/21 1153 97.8 F (36.6 C)     Temp Source 05/21/21 1153 Oral     SpO2 05/21/21 1153 99 %     Weight 05/21/21 1147 166 lb (75.3 kg)     Height 05/21/21 1147 5\' 2"  (1.575 m)     Head Circumference --      Peak Flow --      Pain Score --      Pain Loc --      Pain Edu? --      Excl. in GC? --     Most recent vital signs: Vitals:   05/21/21 1254  05/21/21 1702  BP: 115/80 127/77  Pulse: 80 80  Resp: 16 16  Temp:    SpO2: 98% 98%    Constitutional: Alert and oriented. Eyes: Conjunctivae are normal. Head: Atraumatic. Nose: No congestion/rhinnorhea. Mouth/Throat: Mucous membranes are moist.  Cardiovascular: Normal rate, regular rhythm. Grossly normal heart sounds.  2+ radial pulses bilaterally. Respiratory: Normal respiratory effort.  No retractions. Lungs CTAB.  No chest wall tenderness to palpation. Gastrointestinal: Soft and nontender. No distention. Musculoskeletal: No lower extremity tenderness nor edema.  Neurologic:  Normal speech and language. No gross focal neurologic deficits are appreciated.    ED Results / Procedures / Treatments   Labs (all labs ordered are listed, but only abnormal results are displayed) Labs Reviewed  BASIC METABOLIC PANEL - Abnormal; Notable for the following components:      Result Value   Glucose, Bld 122 (*)    All other components within normal limits  CBC - Abnormal; Notable for the following components:   WBC 3.5 (*)    RBC 5.50 (*)    HCT 46.7 (*)    All other components within normal limits  TROPONIN I (HIGH SENSITIVITY)  TROPONIN I (HIGH SENSITIVITY)     EKG  ED ECG REPORT I, Chesley Noon, the attending physician, personally viewed and interpreted this ECG.   Date: 05/21/2021  EKG Time: 11:46  Rate: 78  Rhythm: normal sinus rhythm  Axis: Normal  Intervals:none  ST&T Change: None  RADIOLOGY Chest x-ray reviewed by me with no infiltrate, edema, or effusion.  PROCEDURES:  Critical Care performed: No  Procedures   MEDICATIONS ORDERED IN ED: Medications  LORazepam (ATIVAN) injection 1 mg (1 mg Intravenous Given 05/21/21 1729)     IMPRESSION / MDM / ASSESSMENT AND PLAN / ED COURSE  I reviewed the triage vital signs and the nursing notes.                              66 y.o. female with past medical history of hypertension and fibromyalgia who presents  to the ED complaining of a couple of days of elevated blood pressure now with pressure in the right side of her chest, right-sided headache, and feeling of heaviness in her right arm since waking up this morning.  Differential diagnosis includes, but is not limited to, ACS, stroke, PE, pneumonia, pneumothorax, hypertensive emergency, and benign elevated blood pressure.  Patient is nontoxic-appearing and in no acute distress, blood pressure seems to have normalized at the time of her arrival to the ED without intervention.  She has no focal neurologic deficits on my exam, overall suspicion for stroke is low and she is not a candidate for tPA given she woke up with the symptoms.  Exam is not consistent with a large vessel occlusion, we will check CT of head but hold off on code stroke.  EKG shows no evidence of arrhythmia or ischemia and troponin is negative, low suspicion for ACS given atypical symptoms but we will trend troponin.  CBC and BMP are unremarkable with no anemia or electrolyte abnormality, chest x-ray is also unremarkable.  Very low suspicion for PE given reassuring vital signs with improving pain.  CT head consistent with chronic microvascular ischemic changes but is difficult to exclude acute infarct.  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  She is appropriate for outpatient management, was counseled to follow-up with her PCP for blood pressure recheck and to follow-up with neurology regarding old stroke.  She was counseled to return to the ED for new or worsening symptoms, patient and daughter agree with plan.        FINAL CLINICAL IMPRESSION(S) / ED DIAGNOSES   Final diagnoses:  Atypical chest pain  Primary hypertension     Rx / DC Orders   ED Discharge Orders     None        Note:  This document was prepared using Dragon voice recognition software and may include  unintentional dictation errors.   Chesley Noon, MD 05/21/21 [phone]

## 2021-05-21 NOTE — ED Notes (Signed)
Provided water to pt per request.

## 2021-05-21 NOTE — ED Notes (Signed)
RN first encounter with pt prior to discharge. Interpreter used to discharge pt. Pt verbalized understanding of discharge instructions and follow-up care instructions. Pt advised if symptoms worsen to return to ED.

## 2021-05-21 NOTE — ED Notes (Signed)
Pt to ED for chest discomfort. States has been feeling intermittent chest pain since yesterday, first on R chest then L chest, felt like pressure on R chest and felt "like I was punched in the chest" on L chest a little while ago. States still feels some light pressure in R chest. Diagnosed with HTN about 1 year ago, takes valsartan and amlodipine.  Denies SOB, abdominal pain, urinary symptoms, NVD except did feel slightly nauseous this morning.  Ambulatory, NAD.

## 2021-05-24 ENCOUNTER — Other Ambulatory Visit: Payer: Self-pay | Admitting: Family Medicine

## 2021-05-24 DIAGNOSIS — Z1231 Encounter for screening mammogram for malignant neoplasm of breast: Secondary | ICD-10-CM

## 2021-06-24 ENCOUNTER — Other Ambulatory Visit: Payer: Self-pay | Admitting: Family Medicine

## 2021-06-24 DIAGNOSIS — R748 Abnormal levels of other serum enzymes: Secondary | ICD-10-CM

## 2021-07-02 ENCOUNTER — Ambulatory Visit
Admission: RE | Admit: 2021-07-02 | Discharge: 2021-07-02 | Disposition: A | Discharge status: Home / Self Care | Payer: Medicare Other | Source: Ambulatory Visit | Attending: Family Medicine | Admitting: Family Medicine

## 2021-07-02 ENCOUNTER — Other Ambulatory Visit: Payer: Self-pay | Admitting: Family Medicine

## 2021-07-02 ENCOUNTER — Other Ambulatory Visit: Payer: Self-pay

## 2021-07-02 DIAGNOSIS — R748 Abnormal levels of other serum enzymes: Secondary | ICD-10-CM | POA: Diagnosis not present

## 2021-08-03 ENCOUNTER — Ambulatory Visit: Payer: Medicare HMO | Admitting: Internal Medicine

## 2021-10-08 ENCOUNTER — Ambulatory Visit
Admission: RE | Admit: 2021-10-08 | Discharge: 2021-10-08 | Disposition: A | Discharge status: Home / Self Care | Payer: Medicare Other | Source: Ambulatory Visit | Attending: Family Medicine | Admitting: Family Medicine

## 2021-10-08 DIAGNOSIS — Z1231 Encounter for screening mammogram for malignant neoplasm of breast: Secondary | ICD-10-CM | POA: Insufficient documentation

## 2021-10-14 ENCOUNTER — Other Ambulatory Visit: Payer: Self-pay | Admitting: Family Medicine

## 2021-10-14 DIAGNOSIS — R928 Other abnormal and inconclusive findings on diagnostic imaging of breast: Secondary | ICD-10-CM

## 2021-10-14 DIAGNOSIS — N6489 Other specified disorders of breast: Secondary | ICD-10-CM

## 2021-10-28 ENCOUNTER — Other Ambulatory Visit: Payer: Medicare Other

## 2021-12-01 ENCOUNTER — Ambulatory Visit
Admission: RE | Admit: 2021-12-01 | Discharge: 2021-12-01 | Disposition: A | Discharge status: Home / Self Care | Payer: Medicare Other | Source: Ambulatory Visit | Attending: Family Medicine | Admitting: Family Medicine

## 2021-12-01 DIAGNOSIS — N6489 Other specified disorders of breast: Secondary | ICD-10-CM

## 2021-12-01 DIAGNOSIS — R928 Other abnormal and inconclusive findings on diagnostic imaging of breast: Secondary | ICD-10-CM | POA: Diagnosis not present

## 2021-12-03 ENCOUNTER — Other Ambulatory Visit: Payer: Self-pay | Admitting: Family Medicine

## 2021-12-03 DIAGNOSIS — R928 Other abnormal and inconclusive findings on diagnostic imaging of breast: Secondary | ICD-10-CM

## 2021-12-03 DIAGNOSIS — N6489 Other specified disorders of breast: Secondary | ICD-10-CM

## 2021-12-08 ENCOUNTER — Ambulatory Visit
Admission: RE | Admit: 2021-12-08 | Discharge: 2021-12-08 | Disposition: A | Discharge status: Home / Self Care | Payer: Medicare Other | Source: Ambulatory Visit | Attending: Family Medicine | Admitting: Family Medicine

## 2021-12-08 DIAGNOSIS — N6489 Other specified disorders of breast: Secondary | ICD-10-CM | POA: Diagnosis present

## 2021-12-08 DIAGNOSIS — R928 Other abnormal and inconclusive findings on diagnostic imaging of breast: Secondary | ICD-10-CM | POA: Insufficient documentation

## 2021-12-08 HISTORY — PX: BREAST BIOPSY: SHX20

## 2021-12-09 LAB — SURGICAL PATHOLOGY

## 2022-01-10 ENCOUNTER — Other Ambulatory Visit: Payer: Self-pay | Admitting: Family Medicine

## 2022-01-10 DIAGNOSIS — N6325 Unspecified lump in the left breast, overlapping quadrants: Secondary | ICD-10-CM

## 2022-01-10 DIAGNOSIS — N6315 Unspecified lump in the right breast, overlapping quadrants: Secondary | ICD-10-CM

## 2022-02-02 ENCOUNTER — Ambulatory Visit
Admission: RE | Admit: 2022-02-02 | Discharge: 2022-02-02 | Disposition: A | Discharge status: Home / Self Care | Payer: Medicare Other | Source: Ambulatory Visit | Attending: Family Medicine | Admitting: Family Medicine

## 2022-02-02 DIAGNOSIS — N6325 Unspecified lump in the left breast, overlapping quadrants: Secondary | ICD-10-CM

## 2022-02-02 DIAGNOSIS — N6315 Unspecified lump in the right breast, overlapping quadrants: Secondary | ICD-10-CM

## 2022-02-08 ENCOUNTER — Other Ambulatory Visit: Payer: Self-pay | Admitting: Family Medicine

## 2022-02-08 DIAGNOSIS — N6489 Other specified disorders of breast: Secondary | ICD-10-CM

## 2022-02-08 DIAGNOSIS — R928 Other abnormal and inconclusive findings on diagnostic imaging of breast: Secondary | ICD-10-CM

## 2022-02-10 ENCOUNTER — Other Ambulatory Visit: Payer: Self-pay | Admitting: Family Medicine

## 2022-02-10 DIAGNOSIS — N6315 Unspecified lump in the right breast, overlapping quadrants: Secondary | ICD-10-CM

## 2022-02-10 DIAGNOSIS — N6325 Unspecified lump in the left breast, overlapping quadrants: Secondary | ICD-10-CM

## 2022-02-18 ENCOUNTER — Ambulatory Visit
Admission: RE | Admit: 2022-02-18 | Discharge: 2022-02-18 | Disposition: A | Discharge status: Home / Self Care | Payer: Medicare Other | Source: Ambulatory Visit | Attending: Family Medicine | Admitting: Family Medicine

## 2022-02-18 DIAGNOSIS — R928 Other abnormal and inconclusive findings on diagnostic imaging of breast: Secondary | ICD-10-CM | POA: Diagnosis present

## 2022-02-18 DIAGNOSIS — N6489 Other specified disorders of breast: Secondary | ICD-10-CM | POA: Diagnosis present

## 2022-02-18 HISTORY — PX: BREAST BIOPSY: SHX20

## 2022-02-21 LAB — SURGICAL PATHOLOGY

## 2022-03-03 ENCOUNTER — Ambulatory Visit
Admission: RE | Admit: 2022-03-03 | Discharge: 2022-03-03 | Disposition: A | Discharge status: Home / Self Care | Payer: Medicare Other | Source: Ambulatory Visit | Attending: Family Medicine | Admitting: Family Medicine

## 2022-03-03 DIAGNOSIS — N6325 Unspecified lump in the left breast, overlapping quadrants: Secondary | ICD-10-CM

## 2022-03-03 DIAGNOSIS — N6315 Unspecified lump in the right breast, overlapping quadrants: Secondary | ICD-10-CM

## 2022-03-03 MED ORDER — GADOPICLENOL 0.5 MMOL/ML IV SOLN
7.0000 mL | Freq: Once | INTRAVENOUS | Status: AC | PRN
  Administered 2022-03-03: 7 mL via INTRAVENOUS

## 2022-03-22 ENCOUNTER — Inpatient Hospital Stay: Payer: Medicare Other

## 2022-03-22 ENCOUNTER — Encounter: Payer: Self-pay | Admitting: Licensed Clinical Social Worker

## 2022-03-22 ENCOUNTER — Inpatient Hospital Stay: Payer: Medicare Other | Attending: Internal Medicine | Admitting: Licensed Clinical Social Worker

## 2022-03-22 DIAGNOSIS — Z8042 Family history of malignant neoplasm of prostate: Secondary | ICD-10-CM | POA: Diagnosis not present

## 2022-03-22 DIAGNOSIS — Z801 Family history of malignant neoplasm of trachea, bronchus and lung: Secondary | ICD-10-CM

## 2022-03-22 DIAGNOSIS — Z803 Family history of malignant neoplasm of breast: Secondary | ICD-10-CM | POA: Diagnosis not present

## 2022-03-22 DIAGNOSIS — Z800 Family history of malignant neoplasm of digestive organs: Secondary | ICD-10-CM | POA: Diagnosis not present

## 2022-03-22 DIAGNOSIS — Z8049 Family history of malignant neoplasm of other genital organs: Secondary | ICD-10-CM | POA: Diagnosis not present

## 2022-03-22 NOTE — Progress Notes (Signed)
REFERRING PROVIDER: Herbert Pun, [address],  [place] [address]  PRIMARY PROVIDER:  Pcp, No  PRIMARY REASON FOR VISIT:  1. Family history of breast cancer   2. Family history of prostate cancer   3. Family history of stomach cancer   4. Family history of uterine cancer   5. Family history of lung cancer      HISTORY OF PRESENT ILLNESS:   Ms. Joellyn Grandt, a 66 y.o. female, was seen for a [facility] cancer genetics consultation at the request of Dr. Windell Moment due to a family history of cancer.  Ms. Zhuri Krass presents to clinic today to discuss the possibility of a hereditary predisposition to cancer, genetic testing, and to further clarify her future cancer risks, as well as potential cancer risks for family members.   CANCER HISTORY:  Ms. Gissel Keilman is a 66 y.o. female with no personal history of cancer.    RISK FACTORS:  Menarche was at age 77.  First live birth at age 32.  OCP use for approximately 2-3 months Ovaries intact: no.  Hysterectomy: yes.  Menopausal status: postmenopausal.  Colonoscopy: yes; normal. Mammogram within the last year: yes. Number of breast biopsies:  reports 4 total breast biopsies .   Past Medical History:  Diagnosis Date   Fibromyalgia    Hypertension     Past Surgical History:  Procedure Laterality Date   ABDOMINAL HYSTERECTOMY     BREAST BIOPSY Left 2013   neg    BREAST BIOPSY Right 12/08/2021   Stereo Bx Right breast, coil clip - BENIGN MAMMARY PARENCHYMA WITH FOCAL FIBROSIS/SCAR, FAT NECROSIS,   BREAST BIOPSY Right 02/18/2022   US biopsy/ path pending   BREAST EXCISIONAL BIOPSY Left 2013   neg   BREAST EXCISIONAL BIOPSY Right    BREAST EXCISIONAL BIOPSY Right    09/2020? per pt in [place]    FAMILY HISTORY:  We obtained a detailed, 4-generation family history.  Significant diagnoses are listed below: Family History  Problem Relation Age of Onset   Breast cancer Sister 66    Prostate cancer Brother 12       metastatic   Breast cancer Paternal 38    Breast cancer Paternal Aunt    Stomach cancer Paternal Aunt    Uterine cancer Paternal Aunt    Pancreatic cancer Paternal Aunt        vs lymphoma   Prostate cancer Paternal Uncle    Prostate cancer Paternal Uncle    Lung cancer Paternal Uncle    Lung cancer Paternal Uncle    Ms. Illona Bulman has 1 son, 1 daughter, no cancers. She has 4 sisters, 6 brothers. One sister died of breast cancer at 29. A brother died of prostate cancer at 38.   Ms. Felesia Stahlecker mother passed at 3, no known cancers on this side of the family.  Ms. Aleynah Rocchio father passed at 58. Patient had 4 paternal uncles - 2 had lung cancer and 2 had prostate cancer. She had 5 paternal aunts - 2 had breast cancer, 1 had stomach cancer, 1 had uterine cancer, and 1 had pancreatic cancer or lymphoma. Two paternal cousins also had breast cancer.   Ms. Maleta Pacha is unaware of previous family history of genetic testing for hereditary cancer risks. There is no reported Ashkenazi Jewish ancestry. There is no known consanguinity.    GENETIC COUNSELING ASSESSMENT: Ms. Liza Czerwinski is a 66 y.o. female with a family history of breast and  other cancers which is somewhat suggestive of a hereditary cancer syndrome and predisposition to cancer. We, therefore, discussed and recommended the following at today's visit.   DISCUSSION: We discussed that approximately 10% of breast cancer is hereditary. Most cases of hereditary breast cancer are associated with BRCA1/BRCA2 genes, although there are other genes associated with hereditary cancer as well. Cancers and risks are gene specific. We discussed that testing is beneficial for several reasons including knowing about cancer risks, identifying potential screening and risk-reduction options that may be appropriate, and to understand if other family members could be at risk for cancer and allow them  to undergo genetic testing.   We reviewed the characteristics, features and inheritance patterns of hereditary cancer syndromes. We also discussed genetic testing, including the appropriate family members to test, the process of testing, insurance coverage and turn-around-time for results. We discussed the implications of a negative, positive and/or variant of uncertain significant result. We recommended Ms. Mickle Asper pursue genetic testing for the [facility] Common Hereditary Cancers+RNA gene panel.   Based on Ms. Jazmine Heckman family history of cancer, she meets medical criteria for genetic testing. Despite that she meets criteria, she may still have an out of pocket cost. We discussed that if her out of pocket cost for testing is over $100, the laboratory will call and confirm whether she wants to proceed with testing.  If the out of pocket cost of testing is less than $100 she will be billed by the genetic testing laboratory.   PLAN: After considering the risks, benefits, and limitations, Ms. Sher Shampine provided informed consent to pursue genetic testing and the blood sample was sent to [facility] for analysis of the Common Hereditary Cancers+RNA panel. Results should be available within approximately 2-3 weeks' time, at which point they will be disclosed by telephone to Ms. Mickle Asper, as will any additional recommendations warranted by these results. Ms. Rael Tilly will receive a summary of her genetic counseling visit and a copy of her results once available. This information will also be available in Epic.   Ms. Brittney Mucha questions were answered to her satisfaction today. Our contact information was provided should additional questions or concerns arise. Thank you for the referral and allowing [place] to share in the care of your patient.   Faith Rogue, MS, [facility] Genetic Counselor [email or web address] Phone: [phone]  The patient was seen for a  total of 30 minutes in face-to-face genetic counseling. Patient was seen with interpreter present via telephone.  Dr. Grayland Ormond was available for discussion regarding this case.   _______________________________________________________________________ For Office Staff:  Number of people involved in session: 2 Was an Intern/ student involved with case: no

## 2022-03-31 ENCOUNTER — Telehealth: Payer: Self-pay | Admitting: Licensed Clinical Social Worker

## 2022-03-31 ENCOUNTER — Encounter: Payer: Self-pay | Admitting: Licensed Clinical Social Worker

## 2022-03-31 ENCOUNTER — Ambulatory Visit: Payer: Self-pay | Admitting: Licensed Clinical Social Worker

## 2022-03-31 DIAGNOSIS — Z1379 Encounter for other screening for genetic and chromosomal anomalies: Secondary | ICD-10-CM

## 2022-03-31 NOTE — Telephone Encounter (Signed)
I contacted [name] to discuss her genetic testing results. No pathogenic variants were identified in the 48 genes analyzed. Detailed clinic note to follow.   The test report has been scanned into EPIC and is located under the Molecular Pathology section of the Results Review tab.  A portion of the result report is included below for reference.      Lacy Duverney, MS, [facility] Genetic Counselor [email or web address] Phone: [phone]

## 2022-03-31 NOTE — Progress Notes (Signed)
HPI:   [name] was previously seen in the [place] clinic due to a family history of breast cancer and concerns regarding a hereditary predisposition to cancer. Please refer to our prior cancer genetics clinic note for more information regarding our discussion, assessment and recommendations, at the time. [name] recent genetic test results were disclosed to her, as were recommendations warranted by these results. These results and recommendations are discussed in more detail below.  CANCER HISTORY:  Oncology History   No history exists.    FAMILY HISTORY:  We obtained a detailed, 4-generation family history.  Significant diagnoses are listed below: Family History  Problem Relation Age of Onset   Breast cancer Sister 14   Prostate cancer Brother 11       metastatic   Breast cancer Paternal 66    Breast cancer Paternal Aunt    Stomach cancer Paternal Aunt    Uterine cancer Paternal Aunt    Pancreatic cancer Paternal Aunt        vs lymphoma   Prostate cancer Paternal Uncle    Prostate cancer Paternal Uncle    Lung cancer Paternal Uncle    Lung cancer Paternal Uncle    [name] has 1 son, 1 daughter, no cancers. She has 4 sisters, 6 brothers. One sister died of breast cancer at 95. A brother died of prostate cancer at 21.    [name] mother passed at 57, no known cancers on this side of the family.   [name] father passed at 38. Patient had 4 paternal uncles - 2 had lung cancer and 2 had prostate cancer. She had 5 paternal aunts - 2 had breast cancer, 1 had stomach cancer, 1 had uterine cancer, and 1 had pancreatic cancer or lymphoma. Two paternal cousins also had breast cancer.    [name] is unaware of previous family history of genetic testing for hereditary cancer risks. There is no reported Ashkenazi Jewish ancestry. There is no known consanguinity.      GENETIC TEST RESULTS:  The  [facility] Common Hereditary Cancers+RNA Panel found no pathogenic mutations.   The Common Hereditary Cancers Panel + RNA offered by [facility] includes sequencing and/or deletion duplication testing of the following 48 genes: APC*, ATM*, AXIN2, BAP1, BARD1, BMPR1A, BRCA1, BRCA2, BRIP1, CDH1, CDK4, CDKN2A (p14ARF), CDKN2A (p16INK4a), CHEK2, CTNNA1, DICER1*, EPCAM*, FH*, GREM1*, HOXB13, KIT, MBD4, MEN1*, MLH1*, MSH2*, MSH3*, MSH6*, MUTYH, NF1*, NTHL1, PALB2, PDGFRA, PMS2*, POLD1*, POLE, PTEN*, RAD51C, RAD51D, SDHA*, SDHB, SDHC*, SDHD, SMAD4, SMARCA4, STK11, TP53, TSC1*, TSC2, VHL.   The test report has been scanned into EPIC and is located under the Molecular Pathology section of the Results Review tab.  A portion of the result report is included below for reference. Genetic testing reported out on 03/30/2022.      Genetic testing identified a variant of uncertain significance (VUS) in the FH gene called c.34C>T.  At this time, it is unknown if this variant is associated with an increased risk for cancer or if it is benign, but most uncertain variants are reclassified to benign. It should not be used to make medical management decisions. With time, we suspect the laboratory will determine the significance of this variant, if any. If the laboratory reclassifies this variant, we will attempt to contact [name] to discuss it further.   Even though a pathogenic variant was not identified, possible explanations for the cancer in the family may include: There may  be no hereditary risk for cancer in the family. The cancers in her family may be sporadic/familial or due to other genetic and environmental factors. There may be a gene mutation in one of these genes that current testing methods cannot detect but that chance is small. There could be another gene that has not yet been discovered, or that we have not yet tested, that is responsible for the cancer diagnoses in the family.  It is also possible  there is a hereditary cause for the cancer in the family that [name] did not inherit.  Therefore, it is important to remain in touch with cancer genetics in the future so that we can continue to offer [name] the most up to date genetic testing.    ADDITIONAL GENETIC TESTING:  We discussed with [name] that her genetic testing was fairly extensive.  If there are additional relevant genes identified to increase cancer risk that can be analyzed in the future, we would be happy to discuss and coordinate this testing at that time.    CANCER SCREENING RECOMMENDATIONS:  [name] test result is considered negative (normal).  This means that we have not identified a hereditary cause for her family history of cancer at this time.   An individual's cancer risk and medical management are not determined by genetic test results alone. Overall cancer risk assessment incorporates additional factors, including personal medical history, family history, and any available genetic information that may result in a personalized plan for cancer prevention and surveillance. Therefore, it is recommended she continue to follow the cancer management and screening guidelines provided by her  primary healthcare provider.  Based on [name] personal and family history of cancer as well as her genetic test results, risk model Mia Alvarez was used to estimate her risk of developing breast cancer. This estimates her lifetime risk of developing breast cancer to be approximately 14.6%.  This is in the setting of "no prior biopsy/no proliferative disease." This number could change should she have had atypia. The patient's lifetime breast cancer risk is a preliminary estimate based on available information using one of several models endorsed by the [employer]  (NCCN). The NCCN recommends consideration of breast MRI screening as an adjunct to  mammography for patients at high risk (defined as 20% or greater lifetime risk).  This risk estimate can change over time and may be repeated to reflect new information in her personal or family history in the future.     RECOMMENDATIONS FOR FAMILY MEMBERS:   Since she did not inherit a identifiable mutation in a cancer predisposition gene included on this panel, her children could not have inherited a known mutation from her in one of these genes. Individuals in this family might be at some increased risk of developing cancer, over the general population risk, due to the family history of cancer.  Individuals in the family should notify their providers of the family history of cancer. We recommend women in this family have a yearly mammogram beginning at age 60, or 51 years younger than the earliest onset of cancer, an annual clinical breast exam, and perform monthly breast self-exams.  Family members should have colonoscopies by at age 22, or earlier, as recommended by their providers. Other members of the family may still carry a pathogenic variant in one of these genes that [name] did not inherit. Based on the family history, we recommend her paternal relatives, especially  those who have had cancer, have genetic counseling and testing. [name] will let us know if we can be of any assistance in coordinating genetic counseling and/or testing for this family member.   We do not recommend familial testing for the FH variant of uncertain significance (VUS).  FOLLOW-UP:  Lastly, we discussed with Ms. Mia Essner that cancer genetics is a rapidly advancing field and it is possible that new genetic tests will be appropriate for her and/or her family members in the future. We encouraged her to remain in contact with cancer genetics on an annual basis so we can update her personal and family histories and let her know of advances in cancer genetics that may benefit this family.    Our contact number was provided. Ms. Mareta Chesnut questions were answered to her satisfaction, and she knows she is welcome to call us at anytime with additional questions or concerns.    Faith Rogue, MS, [facility] Genetic Counselor [email or web address] Phone: [phone]

## 2022-10-04 ENCOUNTER — Other Ambulatory Visit: Payer: Self-pay | Admitting: Student

## 2022-10-04 DIAGNOSIS — R519 Headache, unspecified: Secondary | ICD-10-CM

## 2022-10-22 ENCOUNTER — Ambulatory Visit
Admission: RE | Admit: 2022-10-22 | Discharge: 2022-10-22 | Disposition: A | Discharge status: Home / Self Care | Payer: Medicaid Other | Source: Ambulatory Visit | Attending: Student | Admitting: Student

## 2022-10-22 DIAGNOSIS — R519 Headache, unspecified: Secondary | ICD-10-CM

## 2022-12-16 ENCOUNTER — Other Ambulatory Visit: Payer: Self-pay | Admitting: Pulmonary Disease

## 2022-12-16 DIAGNOSIS — J849 Interstitial pulmonary disease, unspecified: Secondary | ICD-10-CM

## 2022-12-20 ENCOUNTER — Ambulatory Visit
Admission: RE | Admit: 2022-12-20 | Discharge: 2022-12-20 | Disposition: A | Discharge status: Home / Self Care | Payer: 59 | Source: Ambulatory Visit | Attending: Pulmonary Disease | Admitting: Pulmonary Disease

## 2022-12-20 DIAGNOSIS — J849 Interstitial pulmonary disease, unspecified: Secondary | ICD-10-CM | POA: Diagnosis present

## 2023-01-25 ENCOUNTER — Other Ambulatory Visit: Payer: Self-pay | Admitting: Family Medicine

## 2023-01-25 DIAGNOSIS — Z1231 Encounter for screening mammogram for malignant neoplasm of breast: Secondary | ICD-10-CM

## 2023-02-08 ENCOUNTER — Ambulatory Visit
Admission: RE | Admit: 2023-02-08 | Discharge: 2023-02-08 | Disposition: A | Discharge status: Home / Self Care | Payer: 59 | Source: Ambulatory Visit | Attending: Family Medicine | Admitting: Family Medicine

## 2023-02-08 DIAGNOSIS — Z1231 Encounter for screening mammogram for malignant neoplasm of breast: Secondary | ICD-10-CM | POA: Insufficient documentation

## 2023-02-10 ENCOUNTER — Encounter: Payer: Self-pay | Admitting: Family Medicine

## 2023-02-23 ENCOUNTER — Other Ambulatory Visit: Payer: Self-pay | Admitting: Family Medicine

## 2023-02-23 DIAGNOSIS — R928 Other abnormal and inconclusive findings on diagnostic imaging of breast: Secondary | ICD-10-CM

## 2023-03-06 ENCOUNTER — Encounter: Payer: Self-pay | Admitting: Family Medicine

## 2023-03-07 ENCOUNTER — Ambulatory Visit
Admission: RE | Admit: 2023-03-07 | Discharge: 2023-03-07 | Disposition: A | Discharge status: Home / Self Care | Payer: 59 | Source: Ambulatory Visit | Attending: Family Medicine | Admitting: Family Medicine

## 2023-03-07 ENCOUNTER — Inpatient Hospital Stay
Admission: RE | Admit: 2023-03-07 | Discharge: 2023-03-07 | Disposition: A | Discharge status: Home / Self Care | Payer: 59 | Source: Ambulatory Visit | Attending: Family Medicine | Admitting: Family Medicine

## 2023-03-07 DIAGNOSIS — R928 Other abnormal and inconclusive findings on diagnostic imaging of breast: Secondary | ICD-10-CM

## 2023-05-02 ENCOUNTER — Other Ambulatory Visit: Payer: Self-pay | Admitting: *Deleted

## 2023-05-02 ENCOUNTER — Encounter: Payer: Self-pay | Admitting: *Deleted

## 2023-05-04 ENCOUNTER — Inpatient Hospital Stay: Payer: 59

## 2023-05-04 ENCOUNTER — Inpatient Hospital Stay: Payer: 59 | Attending: Internal Medicine | Admitting: Internal Medicine

## 2023-05-04 DIAGNOSIS — Z800 Family history of malignant neoplasm of digestive organs: Secondary | ICD-10-CM | POA: Insufficient documentation

## 2023-05-04 DIAGNOSIS — I10 Essential (primary) hypertension: Secondary | ICD-10-CM | POA: Diagnosis not present

## 2023-05-04 DIAGNOSIS — Z8042 Family history of malignant neoplasm of prostate: Secondary | ICD-10-CM | POA: Diagnosis not present

## 2023-05-04 DIAGNOSIS — D709 Neutropenia, unspecified: Secondary | ICD-10-CM

## 2023-05-04 DIAGNOSIS — Z801 Family history of malignant neoplasm of trachea, bronchus and lung: Secondary | ICD-10-CM | POA: Diagnosis not present

## 2023-05-04 DIAGNOSIS — Z79899 Other long term (current) drug therapy: Secondary | ICD-10-CM | POA: Insufficient documentation

## 2023-05-04 DIAGNOSIS — M797 Fibromyalgia: Secondary | ICD-10-CM | POA: Diagnosis not present

## 2023-05-04 DIAGNOSIS — Z7951 Long term (current) use of inhaled steroids: Secondary | ICD-10-CM | POA: Diagnosis not present

## 2023-05-04 DIAGNOSIS — Z803 Family history of malignant neoplasm of breast: Secondary | ICD-10-CM | POA: Diagnosis not present

## 2023-05-04 DIAGNOSIS — Z7982 Long term (current) use of aspirin: Secondary | ICD-10-CM | POA: Diagnosis not present

## 2023-05-04 LAB — CBC WITH DIFFERENTIAL/PLATELET
Abs Immature Granulocytes: 0.01 10*3/uL (ref 0.00–0.07)
Basophils Absolute: 0 10*3/uL (ref 0.0–0.1)
Basophils Relative: 1 %
Eosinophils Absolute: 0.1 10*3/uL (ref 0.0–0.5)
Eosinophils Relative: 2 %
HCT: 48.3 % — ABNORMAL HIGH (ref 36.0–46.0)
Hemoglobin: 15.6 g/dL — ABNORMAL HIGH (ref 12.0–15.0)
Immature Granulocytes: 0 %
Lymphocytes Relative: 46 %
Lymphs Abs: 1.3 10*3/uL (ref 0.7–4.0)
MCH: 26.3 pg (ref 26.0–34.0)
MCHC: 32.3 g/dL (ref 30.0–36.0)
MCV: 81.3 fL (ref 80.0–100.0)
Monocytes Absolute: 0.2 10*3/uL (ref 0.1–1.0)
Monocytes Relative: 7 %
Neutro Abs: 1.2 10*3/uL — ABNORMAL LOW (ref 1.7–7.7)
Neutrophils Relative %: 44 %
Platelets: 244 10*3/uL (ref 150–400)
RBC: 5.94 MIL/uL — ABNORMAL HIGH (ref 3.87–5.11)
RDW: 13.3 % (ref 11.5–15.5)
WBC: 2.8 10*3/uL — ABNORMAL LOW (ref 4.0–10.5)
nRBC: 0 % (ref 0.0–0.2)

## 2023-05-04 LAB — COMPREHENSIVE METABOLIC PANEL
ALT: 44 U/L (ref 0–44)
AST: 39 U/L (ref 15–41)
Albumin: 4.5 g/dL (ref 3.5–5.0)
Alkaline Phosphatase: 119 U/L (ref 38–126)
Anion gap: 12 (ref 5–15)
BUN: 12 mg/dL (ref 8–23)
CO2: 26 mmol/L (ref 22–32)
Calcium: 9.5 mg/dL (ref 8.9–10.3)
Chloride: 101 mmol/L (ref 98–111)
Creatinine, Ser: 0.71 mg/dL (ref 0.44–1.00)
GFR, Estimated: >60 mL/min (ref >60)
Glucose, Bld: 85 mg/dL (ref 70–99)
Potassium: 3.8 mmol/L (ref 3.5–5.1)
Sodium: 139 mmol/L (ref 135–145)
Total Bilirubin: 0.7 mg/dL (ref 0.0–1.2)
Total Protein: 7.7 g/dL (ref 6.5–8.1)

## 2023-05-04 LAB — FOLATE: Folate: 13.3 ng/mL (ref >5.9)

## 2023-05-04 LAB — TECHNOLOGIST SMEAR REVIEW: Plt Morphology: ADEQUATE

## 2023-05-04 LAB — HEPATITIS C ANTIBODY: HCV Ab: NONREACTIVE

## 2023-05-04 LAB — HIV ANTIBODY (ROUTINE TESTING W REFLEX): HIV Screen 4th Generation wRfx: NONREACTIVE

## 2023-05-04 LAB — LACTATE DEHYDROGENASE: LDH: 146 U/L (ref 98–192)

## 2023-05-04 LAB — VITAMIN B12: Vitamin B-12: 752 pg/mL (ref 180–914)

## 2023-05-04 LAB — HEPATITIS B SURFACE ANTIGEN: Hepatitis B Surface Ag: NONREACTIVE

## 2023-05-04 NOTE — Progress Notes (Signed)
 [facility]  Telephone:(336) [phone] Fax:(336) [phone]  ID: Mia Alvarez OB: [birth year]  MR#: [id number]  RDW#:[id number]  Patient Care Team: Pcp, No as PCP - General Mia Arvin, MD as Consulting Physician (Oncology)  REFERRING PROVIDER: Gaetana Haddock, NP  REASON FOR REFERRAL: Neutropenia  HPI: Mia Alvarez is a 68 y.o. female with past medical history of fibromyalgia, hypertension referred to hematology for workup of neutropenia. She is Spanish-speaking.  Interpreter was used. On lab review, patient has low WBC at least since 201.8.  However it has been progressive associated with neutropenia with lowest ANC of 1.2.  Hemoglobin and platelets are normal. Patient denies any history of smoking, alcohol use or recreational drug use. Denies frequent infections.  Denies any history of autoimmune disease. Denies use of over-the-counter herbal supplements.  She uses vitamin B12 OTC.   REVIEW OF SYSTEMS:   ROS  As per HPI. Otherwise, a complete review of systems is negative.  PAST MEDICAL HISTORY: Past Medical History:  Diagnosis Date   Fibromyalgia    Hypertension     PAST SURGICAL HISTORY: Past Surgical History:  Procedure Laterality Date   ABDOMINAL HYSTERECTOMY     BREAST BIOPSY Left 2013   neg    BREAST BIOPSY Right 12/08/2021   Stereo Bx Right breast, coil clip - BENIGN MAMMARY PARENCHYMA WITH FOCAL FIBROSIS/SCAR, FAT NECROSIS,   BREAST BIOPSY Right 02/18/2022   us  biopsy/ fat necrosis   BREAST EXCISIONAL BIOPSY Left 2013   neg   BREAST EXCISIONAL BIOPSY Right    BREAST EXCISIONAL BIOPSY Right    09/2020? per pt in [place]    FAMILY HISTORY: Family History  Problem Relation Age of Onset   Breast cancer Sister 75   Prostate cancer Brother 60       metastatic   Breast cancer Paternal Aunt    Breast cancer Paternal Aunt    Stomach cancer Paternal Aunt    Uterine cancer Paternal Aunt    Pancreatic cancer Paternal Aunt         vs lymphoma   Prostate cancer Paternal Uncle    Prostate cancer Paternal Uncle    Lung cancer Paternal Uncle    Lung cancer Paternal Uncle     HEALTH MAINTENANCE: Social History   Tobacco Use   Smoking status: Never   Smokeless tobacco: Never  Substance Use Topics   Alcohol use: No   Drug use: No     Allergies  Allergen Reactions   Guaifenesin Other (See Comments)    Bruising only with 1200 mg   Amoxicillin    Codeine Nausea And Vomiting    Current Outpatient Medications  Medication Sig Dispense Refill   amLODipine (NORVASC) 5 MG tablet Take 5 mg by mouth daily.     aspirin EC 81 MG tablet Take 81 mg by mouth daily.     amLODipine-benazepril (LOTREL) 5-10 MG capsule Take 1 capsule by mouth daily. (Patient not taking: Reported on 05/04/2023)     atorvastatin (LIPITOR) 10 MG tablet Take 10 mg by mouth daily. (Patient not taking: Reported on 05/04/2023)     budesonide-formoterol (SYMBICORT) 160-4.5 MCG/ACT inhaler Inhale 2 puffs into the lungs 2 (two) times daily. (Patient not taking: Reported on 05/04/2023)     Cholecalciferol (VITAMIN D3) 50 MCG (2000 UT) capsule Take 1 capsule by mouth daily. (Patient not taking: Reported on 05/04/2023)     fexofenadine (ALLEGRA) 180 MG tablet Take 180 mg by mouth daily. (Patient not taking: Reported on  05/04/2023)     gabapentin (NEURONTIN) 100 MG capsule Take 100 mg by mouth 2 (two) times daily. (Patient not taking: Reported on 05/04/2023)     loratadine (CLARITIN) 10 MG tablet Take 10 mg by mouth daily. (Patient not taking: Reported on 05/04/2023)     montelukast (SINGULAIR) 10 MG tablet Take 10 mg by mouth as needed. (Patient not taking: Reported on 05/04/2023)     omeprazole (PRILOSEC) 40 MG capsule Take 40 mg by mouth daily. (Patient not taking: Reported on 05/04/2023)     RYALTRIS 665-25 MCG/ACT SUSP Place into both nostrils. (Patient not taking: Reported on 05/04/2023)     traZODone (DESYREL) 50 MG tablet Take 50 mg by mouth at bedtime. (Patient  not taking: Reported on 05/04/2023)     TRELEGY ELLIPTA 200-62.5-25 MCG/ACT AEPB Take 1 puff by mouth daily. (Patient not taking: Reported on 05/04/2023)     triamcinolone cream (KENALOG) 0.1 % Apply 1 Application topically 2 (two) times daily. (Patient not taking: Reported on 05/04/2023)     valsartan (DIOVAN) 320 MG tablet Take 320 mg by mouth daily. (Patient not taking: Reported on 05/04/2023)     VENTOLIN HFA 108 (90 Base) MCG/ACT inhaler Inhale 1-2 puffs into the lungs every 6 (six) hours as needed. (Patient not taking: Reported on 05/04/2023)     No current facility-administered medications for this visit.    OBJECTIVE: There were no vitals filed for this visit.   There is no height or weight on file to calculate BMI.      General: Well-developed, well-nourished, no acute distress. Eyes: Pink conjunctiva, anicteric sclera. HEENT: Normocephalic, moist mucous membranes, clear oropharnyx. Lungs: Clear to auscultation bilaterally. Heart: Regular rate and rhythm. No rubs, murmurs, or gallops. Abdomen: Soft, nontender, nondistended. No organomegaly noted, normoactive bowel sounds. Musculoskeletal: No edema, cyanosis, or clubbing. Neuro: Alert, answering all questions appropriately. Cranial nerves grossly intact. Skin: No rashes or petechiae noted. Psych: Normal affect. Lymphatics: No cervical, calvicular, axillary or inguinal LAD.   LAB RESULTS:  Lab Results  Component Value Date   NA 140 05/21/2021   K 4.4 05/21/2021   CL 105 05/21/2021   CO2 28 05/21/2021   GLUCOSE 122 (H) 05/21/2021   BUN 9 05/21/2021   CREATININE 0.76 05/21/2021   CALCIUM 9.2 05/21/2021   PROT 6.6 07/04/2020   ALBUMIN 3.9 07/04/2020   AST 31 07/04/2020   ALT 44 07/04/2020   ALKPHOS 99 07/04/2020   BILITOT 0.6 07/04/2020   GFRNONAA >60 05/21/2021    Lab Results  Component Value Date   WBC 3.5 (L) 05/21/2021   NEUTROABS 1.8 07/04/2020   HGB 14.5 05/21/2021   HCT 46.7 (H) 05/21/2021   MCV 84.9 05/21/2021    PLT 237 05/21/2021    No results found for: TIBC, FERRITIN, IRONPCTSAT   STUDIES: No results found.  ASSESSMENT AND PLAN:   Mia Alvarez is a 68 y.o. female with pmh of fibromyalgia, hypertension referred to hematology for workup of neutropenia.  # Neutropenia -Unknown etiology.  Platelets and hemoglobin are normal. -On lab review, patient has low WBC at least since 2018.  However it has been progressive associated with neutropenia with lowest ANC of 1.2.   -Discussed about different etiologies of neutropenia such as nutritional deficiencies, viral infections, autoimmune conditions or bone marrow disorder.  Will obtain labs as below.  Medication list reviewed and no offending agents identified.  Denies any alcohol or recreational drug use.  Denies any use of herbal supplements OTC.  Denies frequent infections.  Orders Placed This Encounter  Procedures   CBC with Differential/Platelet   Vitamin B12   Folate   Flow cytometry panel-leukemia/lymphoma work-up   Comprehensive metabolic panel   Hepatitis B surface antigen   Hepatitis C antibody   HIV ANTIBODY (ROUTINE TETSING W RELFEX)   ANA w/Reflex   Lactate dehydrogenase   Technologist smear review   RTC in 2 weeks to discuss labs. Patient expressed understanding and was in agreement with this plan. She also understands that She can call clinic at any time with any questions, concerns, or complaints.   I spent a total of 45 minutes reviewing chart data, face-to-face evaluation with the patient, counseling and coordination of care as detailed above.  Genevia Rous, MD   05/04/2023 11:45 AM

## 2023-05-04 NOTE — Progress Notes (Signed)
 Patient states that back in October she started having bruises pop up on her body, and when the blood work was done it showed low white blood cell count. Over all she is doing ok. She had a cancer screening test done here last year.

## 2023-05-05 LAB — ANA W/REFLEX: Anti Nuclear Antibody (ANA): NEGATIVE

## 2023-05-08 LAB — COMP PANEL: LEUKEMIA/LYMPHOMA

## 2023-05-19 ENCOUNTER — Inpatient Hospital Stay (HOSPITAL_BASED_OUTPATIENT_CLINIC_OR_DEPARTMENT_OTHER): Payer: 59 | Admitting: Internal Medicine

## 2023-05-19 ENCOUNTER — Encounter: Payer: Self-pay | Admitting: Internal Medicine

## 2023-05-19 VITALS — BP 111/78 | HR 81 | Temp 97.4°F | Resp 18 | Wt 180.0 lb

## 2023-05-19 DIAGNOSIS — D704 Cyclic neutropenia: Secondary | ICD-10-CM

## 2023-05-19 DIAGNOSIS — D709 Neutropenia, unspecified: Secondary | ICD-10-CM | POA: Diagnosis not present

## 2023-05-19 NOTE — Progress Notes (Signed)
[facility]  Telephone:(336) [phone] Fax:(336) [phone]  ID: Mia Alvarez OB: 1955-06-18  MR#: [id number]  AOZ#:[id number]  Patient Care Team: Pcp, No as PCP - General Michaelyn Barter, MD as Consulting Physician (Oncology)  Reason for visit-neutropenia  HPI: Mia Alvarez is a 68 y.o. female with past medical history of fibromyalgia, hypertension referred to hematology for workup of neutropenia. She is Spanish-speaking.  Interpreter was used. On lab review, patient has low WBC at least since 2018.  Her ANC fluctuates between normal to lowest at 1.2.  Hemoglobin and platelets are normal. Patient denies any history of smoking, alcohol use or recreational drug use. Denies frequent infections.  Denies any history of autoimmune disease.  Denies any gastric bypass surgery. Denies use of over-the-counter herbal supplements.  She uses vitamin B12 OTC.  She was evaluated by hematology at [facility] in 2018.  She had serial weekly CBC back then which showed concerns for cyclic neutropenia.  Other workup for neutropenia was negative.  Interval history Patient was seen today in the clinic accompanied with Spanish interpreter. She is doing well overall.  Denies any new concerns.  Denies any infections.  REVIEW OF SYSTEMS:   ROS  As per HPI. Otherwise, a complete review of systems is negative.  PAST MEDICAL HISTORY: Past Medical History:  Diagnosis Date   Fibromyalgia    Hypertension     PAST SURGICAL HISTORY: Past Surgical History:  Procedure Laterality Date   ABDOMINAL HYSTERECTOMY     BREAST BIOPSY Left 2013   neg    BREAST BIOPSY Right 12/08/2021   Stereo Bx Right breast, coil clip - BENIGN MAMMARY PARENCHYMA WITH FOCAL FIBROSIS/SCAR, FAT NECROSIS,   BREAST BIOPSY Right 02/18/2022   US biopsy/ fat necrosis   BREAST EXCISIONAL BIOPSY Left 2013   neg   BREAST EXCISIONAL BIOPSY Right    BREAST EXCISIONAL BIOPSY Right    09/2020? per pt in  [place]    FAMILY HISTORY: Family History  Problem Relation Age of Onset   Breast cancer Sister 59   Prostate cancer Brother 46       metastatic   Breast cancer Paternal Aunt    Breast cancer Paternal Aunt    Stomach cancer Paternal Aunt    Uterine cancer Paternal Aunt    Pancreatic cancer Paternal Aunt        vs lymphoma   Prostate cancer Paternal Uncle    Prostate cancer Paternal Uncle    Lung cancer Paternal Uncle    Lung cancer Paternal Uncle     HEALTH MAINTENANCE: Social History   Tobacco Use   Smoking status: Never   Smokeless tobacco: Never  Substance Use Topics   Alcohol use: No   Drug use: No     Allergies  Allergen Reactions   Guaifenesin Other (See Comments)    Bruising only with 1200 mg   Amoxicillin    Codeine Nausea And Vomiting    Current Outpatient Medications  Medication Sig Dispense Refill   amLODipine (NORVASC) 5 MG tablet Take 5 mg by mouth daily.     aspirin EC 81 MG tablet Take 81 mg by mouth daily.     amLODipine-benazepril (LOTREL) 5-10 MG capsule Take 1 capsule by mouth daily. (Patient not taking: Reported on 05/19/2023)     atorvastatin (LIPITOR) 10 MG tablet Take 10 mg by mouth daily. (Patient not taking: Reported on 05/19/2023)     budesonide-formoterol (SYMBICORT) 160-4.5 MCG/ACT inhaler Inhale 2 puffs into the lungs  2 (two) times daily. (Patient not taking: Reported on 05/19/2023)     Cholecalciferol (VITAMIN D3) 50 MCG (2000 UT) capsule Take 1 capsule by mouth daily. (Patient not taking: Reported on 05/19/2023)     fexofenadine (ALLEGRA) 180 MG tablet Take 180 mg by mouth daily. (Patient not taking: Reported on 05/19/2023)     loratadine (CLARITIN) 10 MG tablet Take 10 mg by mouth daily. (Patient not taking: Reported on 05/19/2023)     montelukast (SINGULAIR) 10 MG tablet Take 10 mg by mouth as needed. (Patient not taking: Reported on 05/19/2023)     omeprazole (PRILOSEC) 40 MG capsule Take 40 mg by mouth daily. (Patient not taking:  Reported on 05/19/2023)     RYALTRIS 665-25 MCG/ACT SUSP Place into both nostrils. (Patient not taking: Reported on 05/19/2023)     traZODone (DESYREL) 50 MG tablet Take 50 mg by mouth at bedtime. (Patient not taking: Reported on 05/19/2023)     TRELEGY ELLIPTA 200-62.5-25 MCG/ACT AEPB Take 1 puff by mouth daily. (Patient not taking: Reported on 05/19/2023)     triamcinolone cream (KENALOG) 0.1 % Apply 1 Application topically 2 (two) times daily. (Patient not taking: Reported on 05/19/2023)     valsartan (DIOVAN) 320 MG tablet Take 320 mg by mouth daily. (Patient not taking: Reported on 05/19/2023)     VENTOLIN HFA 108 (90 Base) MCG/ACT inhaler Inhale 1-2 puffs into the lungs every 6 (six) hours as needed. (Patient not taking: Reported on 05/19/2023)     No current facility-administered medications for this visit.    OBJECTIVE: Vitals:   05/19/23 1052  BP: 111/78  Pulse: 81  Resp: 18  Temp: (!) 97.4 F (36.3 C)  SpO2: 97%     Body mass index is 32.92 kg/m.      General: Well-developed, well-nourished, no acute distress. Eyes: Pink conjunctiva, anicteric sclera. HEENT: Normocephalic, moist mucous membranes, clear oropharnyx. Lungs: Clear to auscultation bilaterally. Heart: Regular rate and rhythm. No rubs, murmurs, or gallops. Abdomen: Soft, nontender, nondistended. No organomegaly noted, normoactive bowel sounds. Musculoskeletal: No edema, cyanosis, or clubbing. Neuro: Alert, answering all questions appropriately. Cranial nerves grossly intact. Skin: No rashes or petechiae noted. Psych: Normal affect. Lymphatics: No cervical, calvicular, axillary or inguinal LAD.   LAB RESULTS:  Lab Results  Component Value Date   NA 139 05/04/2023   K 3.8 05/04/2023   CL 101 05/04/2023   CO2 26 05/04/2023   GLUCOSE 85 05/04/2023   BUN 12 05/04/2023   CREATININE 0.71 05/04/2023   CALCIUM 9.5 05/04/2023   PROT 7.7 05/04/2023   ALBUMIN 4.5 05/04/2023   AST 39 05/04/2023   ALT 44 05/04/2023    ALKPHOS 119 05/04/2023   BILITOT 0.7 05/04/2023   GFRNONAA >60 05/04/2023    Lab Results  Component Value Date   WBC 2.8 (L) 05/04/2023   NEUTROABS 1.2 (L) 05/04/2023   HGB 15.6 (H) 05/04/2023   HCT 48.3 (H) 05/04/2023   MCV 81.3 05/04/2023   PLT 244 05/04/2023    No results found for: "TIBC", "FERRITIN", "IRONPCTSAT"   STUDIES: No results found.  ASSESSMENT AND PLAN:   Mia Alvarez is a 68 y.o. female with pmh of fibromyalgia, hypertension referred to hematology for workup of neutropenia.  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  Her ANC fluctuates between normal to lowest at 1.2.   -Previously evaluated by [facility] hematology in 2018.  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   -Workup negative otherwise-vitamin B12/folate normal.  Flow cytometry normal.  Hepatitis B/C/HIV nonreactive.  For some reason hep B antibody not drawn we will repeat it next time.  Previously has been negative.  ANA negative.  LDH normal.  No history of gastric bypass surgery so less likely to have copper deficiency.  Copper level has been normal in the past.   -Her ANC has never gone below 1000.  Denies any history of frequent infections.  It is less likely to be constitutional neutropenia since she does not belong to Mediterranean or African descent.  No indication for bone marrow biopsy at this time.  No further intervention indicated since she has been asymptomatic.  I will follow-up with her one more time in 6 months for H&P and labs.  If she is doing well at that time we can consider discharge.   Orders Placed This Encounter  Procedures   CBC with Differential/Platelet   Hepatitis B core antibody, total   RTC in 6 months for MD visit, labs.  Patient expressed understanding and was in agreement with this  plan. She also understands that She can call clinic at any time with any questions, concerns, or complaints.   I spent a total of 30 minutes reviewing chart data, face-to-face evaluation with the patient, counseling and coordination of care as detailed above.  Michaelyn Barter, MD   05/19/2023 4:16 PM

## 2023-05-19 NOTE — Progress Notes (Signed)
Patient is having some acid reflux.

## 2023-06-27 ENCOUNTER — Other Ambulatory Visit: Payer: Self-pay | Admitting: Family Medicine

## 2023-06-27 DIAGNOSIS — N6311 Unspecified lump in the right breast, upper outer quadrant: Secondary | ICD-10-CM

## 2023-06-27 DIAGNOSIS — N644 Mastodynia: Secondary | ICD-10-CM

## 2023-07-24 ENCOUNTER — Ambulatory Visit
Admission: RE | Admit: 2023-07-24 | Discharge: 2023-07-24 | Disposition: A | Discharge status: Home / Self Care | Source: Ambulatory Visit | Attending: Family Medicine | Admitting: Family Medicine

## 2023-07-24 DIAGNOSIS — N644 Mastodynia: Secondary | ICD-10-CM | POA: Diagnosis present

## 2023-07-24 DIAGNOSIS — N6311 Unspecified lump in the right breast, upper outer quadrant: Secondary | ICD-10-CM | POA: Insufficient documentation

## 2023-09-22 ENCOUNTER — Ambulatory Visit: Attending: Otolaryngology

## 2023-09-22 DIAGNOSIS — I10 Essential (primary) hypertension: Secondary | ICD-10-CM | POA: Insufficient documentation

## 2023-09-22 DIAGNOSIS — G4733 Obstructive sleep apnea (adult) (pediatric): Secondary | ICD-10-CM | POA: Insufficient documentation

## 2023-09-22 DIAGNOSIS — R0683 Snoring: Secondary | ICD-10-CM | POA: Insufficient documentation

## 2023-09-22 DIAGNOSIS — J45909 Unspecified asthma, uncomplicated: Secondary | ICD-10-CM | POA: Insufficient documentation

## 2023-11-16 ENCOUNTER — Other Ambulatory Visit: Payer: 59

## 2023-11-16 ENCOUNTER — Ambulatory Visit: Payer: 59 | Admitting: Internal Medicine

## 2023-11-16 ENCOUNTER — Ambulatory Visit: Admitting: Oncology

## 2023-11-22 ENCOUNTER — Other Ambulatory Visit: Payer: Self-pay

## 2023-11-22 DIAGNOSIS — D709 Neutropenia, unspecified: Secondary | ICD-10-CM

## 2023-11-22 DIAGNOSIS — D704 Cyclic neutropenia: Secondary | ICD-10-CM

## 2023-11-23 ENCOUNTER — Inpatient Hospital Stay: Attending: Oncology

## 2023-11-23 ENCOUNTER — Encounter: Payer: Self-pay | Admitting: Oncology

## 2023-11-23 ENCOUNTER — Inpatient Hospital Stay (HOSPITAL_BASED_OUTPATIENT_CLINIC_OR_DEPARTMENT_OTHER): Admitting: Oncology

## 2023-11-23 VITALS — BP 126/71 | HR 72 | Temp 97.8°F | Resp 18 | Wt 161.0 lb

## 2023-11-23 DIAGNOSIS — Z79899 Other long term (current) drug therapy: Secondary | ICD-10-CM | POA: Insufficient documentation

## 2023-11-23 DIAGNOSIS — Z8042 Family history of malignant neoplasm of prostate: Secondary | ICD-10-CM | POA: Diagnosis not present

## 2023-11-23 DIAGNOSIS — D709 Neutropenia, unspecified: Secondary | ICD-10-CM | POA: Diagnosis not present

## 2023-11-23 DIAGNOSIS — Z803 Family history of malignant neoplasm of breast: Secondary | ICD-10-CM | POA: Insufficient documentation

## 2023-11-23 DIAGNOSIS — Z800 Family history of malignant neoplasm of digestive organs: Secondary | ICD-10-CM | POA: Diagnosis not present

## 2023-11-23 DIAGNOSIS — Z801 Family history of malignant neoplasm of trachea, bronchus and lung: Secondary | ICD-10-CM | POA: Insufficient documentation

## 2023-11-23 DIAGNOSIS — D704 Cyclic neutropenia: Secondary | ICD-10-CM

## 2023-11-23 LAB — CBC WITH DIFFERENTIAL (CANCER CENTER ONLY)
Abs Immature Granulocytes: 0 K/uL (ref 0.00–0.07)
Basophils Absolute: 0 K/uL (ref 0.0–0.1)
Basophils Relative: 1 %
Eosinophils Absolute: 0.1 K/uL (ref 0.0–0.5)
Eosinophils Relative: 2 %
HCT: 42.9 % (ref 36.0–46.0)
Hemoglobin: 13.7 g/dL (ref 12.0–15.0)
Immature Granulocytes: 0 %
Lymphocytes Relative: 46 %
Lymphs Abs: 1.6 K/uL (ref 0.7–4.0)
MCH: 26.4 pg (ref 26.0–34.0)
MCHC: 31.9 g/dL (ref 30.0–36.0)
MCV: 82.7 fL (ref 80.0–100.0)
Monocytes Absolute: 0.3 K/uL (ref 0.1–1.0)
Monocytes Relative: 8 %
Neutro Abs: 1.5 K/uL — ABNORMAL LOW (ref 1.7–7.7)
Neutrophils Relative %: 43 %
Platelet Count: 225 K/uL (ref 150–400)
RBC: 5.19 MIL/uL — ABNORMAL HIGH (ref 3.87–5.11)
RDW: 13.7 % (ref 11.5–15.5)
WBC Count: 3.5 K/uL — ABNORMAL LOW (ref 4.0–10.5)
nRBC: 0 % (ref 0.0–0.2)

## 2023-11-23 NOTE — Progress Notes (Signed)
 Patient is doing well, no new questions for the doctor today. July 4th left side of the nostril bled for a couple of minutes.

## 2023-11-23 NOTE — Progress Notes (Signed)
 [facility]  [phone]) [phone] [phone]  ID: Mia Alvarez OB: 1955-08-28  MR#: [id number]  RDW#:[id number]  Patient Care Team: Pcp, No as PCP - General Jacobo Evalene PARAS, MD as Consulting Physician (Oncology)  CHIEF COMPLAINT: Neutropenia.  INTERVAL HISTORY: Patient returns to clinic today for routine 48-month evaluation and discussion of her laboratory results.  She continues to feel well and remains asymptomatic.  She denies any recent fevers or illnesses.  She has a good appetite and denies weight loss.  There is no neurologic complaints.  She denies any chest pain, shortness of breath, cough, or hemoptysis.  She denies any nausea, vomiting, constipation, or diarrhea.  She has no urinary complaints.  Patient feels at her baseline and offers no specific complaints today.  REVIEW OF SYSTEMS:   Review of Systems  Constitutional: Negative.  Negative for fever, malaise/fatigue and weight loss.  Respiratory: Negative.  Negative for cough, hemoptysis and shortness of breath.   Cardiovascular: Negative.  Negative for chest pain and leg swelling.  Gastrointestinal: Negative.  Negative for abdominal pain.  Genitourinary: Negative.  Negative for dysuria and hematuria.  Musculoskeletal: Negative.  Negative for back pain.  Skin: Negative.  Negative for rash.  Neurological: Negative.  Negative for dizziness, focal weakness, weakness and headaches.  Psychiatric/Behavioral: Negative.  The patient is not nervous/anxious.     As per HPI. Otherwise, a complete review of systems is negative.  PAST MEDICAL HISTORY: Past Medical History:  Diagnosis Date   Fibromyalgia    Hypertension     PAST SURGICAL HISTORY: Past Surgical History:  Procedure Laterality Date   ABDOMINAL HYSTERECTOMY     BREAST BIOPSY Left 2013   neg    BREAST BIOPSY Right 12/08/2021   Stereo Bx Right breast, coil clip - BENIGN MAMMARY PARENCHYMA WITH FOCAL FIBROSIS/SCAR, FAT  NECROSIS,   BREAST BIOPSY Right 02/18/2022   us  biopsy/ fat necrosis   BREAST EXCISIONAL BIOPSY Left 2013   neg   BREAST EXCISIONAL BIOPSY Right    BREAST EXCISIONAL BIOPSY Right    09/2020? per pt in [place]    FAMILY HISTORY: Family History  Problem Relation Age of Onset   Breast cancer Sister 30   Prostate cancer Brother 90       metastatic   Breast cancer Paternal Aunt    Breast cancer Paternal Aunt    Stomach cancer Paternal Aunt    Uterine cancer Paternal Aunt    Pancreatic cancer Paternal Aunt        vs lymphoma   Prostate cancer Paternal Uncle    Prostate cancer Paternal Uncle    Lung cancer Paternal Uncle    Lung cancer Paternal Uncle     ADVANCED DIRECTIVES (Y/N):  N  HEALTH MAINTENANCE: Social History   Tobacco Use   Smoking status: Never   Smokeless tobacco: Never  Substance Use Topics   Alcohol use: No   Drug use: No     Colonoscopy:  PAP:  Bone density:  Lipid panel:  Allergies  Allergen Reactions   Guaifenesin Other (See Comments)    Bruising only with 1200 mg   Amoxicillin    Codeine Nausea And Vomiting    Current Outpatient Medications  Medication Sig Dispense Refill   amLODipine (NORVASC) 5 MG tablet Take 5 mg by mouth daily.     aspirin EC 81 MG tablet Take 81 mg by mouth daily.     loratadine (CLARITIN) 10 MG tablet Take 10 mg by mouth daily.  amLODipine-benazepril (LOTREL) 5-10 MG capsule Take 1 capsule by mouth daily. (Patient not taking: Reported on 11/23/2023)     Cholecalciferol (VITAMIN D3) 50 MCG (2000 UT) capsule Take 1 capsule by mouth daily. (Patient not taking: Reported on 11/23/2023)     omeprazole (PRILOSEC) 40 MG capsule Take 40 mg by mouth daily. (Patient not taking: Reported on 11/23/2023)     TRELEGY ELLIPTA 200-62.5-25 MCG/ACT AEPB Take 1 puff by mouth daily. (Patient not taking: Reported on 11/23/2023)     triamcinolone cream (KENALOG) 0.1 % Apply 1 Application topically 2 (two) times daily. (Patient not taking:  Reported on 11/23/2023)     No current facility-administered medications for this visit.    OBJECTIVE: Vitals:   11/23/23 0947  BP: 126/71  Pulse: 72  Resp: 18  Temp: 97.8 F (36.6 C)  SpO2: 100%     Body mass index is 29.45 kg/m.    ECOG FS:0 - Asymptomatic  General: Well-developed, well-nourished, no acute distress. Eyes: Pink conjunctiva, anicteric sclera. HEENT: Normocephalic, moist mucous membranes. Lungs: No audible wheezing or coughing. Heart: Regular rate and rhythm. Abdomen: Soft, nontender, no obvious distention. Musculoskeletal: No edema, cyanosis, or clubbing. Neuro: Alert, answering all questions appropriately. Cranial nerves grossly intact. Skin: No rashes or petechiae noted. Psych: Normal affect. Lymphatics: No cervical, calvicular, axillary or inguinal LAD.   LAB RESULTS:  Lab Results  Component Value Date   NA 139 05/04/2023   K 3.8 05/04/2023   CL 101 05/04/2023   CO2 26 05/04/2023   GLUCOSE 85 05/04/2023   BUN 12 05/04/2023   CREATININE 0.71 05/04/2023   CALCIUM 9.5 05/04/2023   PROT 7.7 05/04/2023   ALBUMIN 4.5 05/04/2023   AST 39 05/04/2023   ALT 44 05/04/2023   ALKPHOS 119 05/04/2023   BILITOT 0.7 05/04/2023   GFRNONAA >60 05/04/2023    Lab Results  Component Value Date   WBC 3.5 (L) 11/23/2023   NEUTROABS 1.5 (L) 11/23/2023   HGB 13.7 11/23/2023   HCT 42.9 11/23/2023   MCV 82.7 11/23/2023   PLT 225 11/23/2023     STUDIES: No results found.  ASSESSMENT: Neutropenia.  PLAN:    Neutropenia: Chronic and unchanged.  Upon review of the chart patient has an intermittently decreased total white blood cell count and ANC since at least 2018.  Her ANC fluctuates between normal and 1.2.  Her most recent result from today is 1.5.  Previously, all of her laboratory work was either negative or within normal limits.  She does not require bone marrow biopsy.  No further intervention is needed.  After discussion with the patient, it was agreed  upon that no further follow-up is necessary.  Please refer patient back if there are any questions or concerns.  Patient's entire visit was done in presence of interpreter.  I spent a total of 30 minutes reviewing chart data, face-to-face evaluation with the patient, counseling and coordination of care as detailed above.    Patient expressed understanding and was in agreement with this plan. She also understands that She can call clinic at any time with any questions, concerns, or complaints.    Evalene JINNY Reusing, MD   11/23/2023 10:16 AM

## 2023-11-24 LAB — HEPATITIS B CORE ANTIBODY, TOTAL: HEP B CORE AB: NEGATIVE

## 2023-12-06 NOTE — Congregational Nurse Program (Signed)
  Dept: [phone]   Congregational Nurse Program Note  Date of Encounter: 11/30/2023  [facility] resident came to have blood pressure checked, BP 120/70, pulse 78 and regular, O2 Sat 99%.  Educated regarding normal blood pressure levels, given information on days and times for other CCNP clinics. Past Medical History: Past Medical History:  Diagnosis Date   Fibromyalgia    Hypertension     Encounter Details:  Community Questionnaire - 11/30/23 1710       Questionnaire   Ask client: Do you give verbal consent for me to treat you today? Yes    Student Assistance N/A    Location Patient Served  N/A    Encounter Setting CN site    Population Status Unknown    Insurance Medicaid;Medicare    Insurance/Financial Assistance Referral N/A    Medication N/A    Medical Provider Yes    Screening Referrals Made N/A    Medical Referrals Made N/A    Medical Appointment Completed N/A    CNP Interventions Advocate/Support;Counsel;Educate    Screenings CN Performed Blood Pressure    ED Visit Averted N/A    Life-Saving Intervention Made N/A

## 2024-01-01 ENCOUNTER — Encounter

## 2024-01-01 ENCOUNTER — Other Ambulatory Visit (INDEPENDENT_AMBULATORY_CARE_PROVIDER_SITE_OTHER): Payer: Self-pay | Admitting: Nurse Practitioner

## 2024-01-01 DIAGNOSIS — I83893 Varicose veins of bilateral lower extremities with other complications: Secondary | ICD-10-CM

## 2024-01-03 ENCOUNTER — Encounter (INDEPENDENT_AMBULATORY_CARE_PROVIDER_SITE_OTHER): Payer: Self-pay | Admitting: Vascular Surgery

## 2024-01-03 ENCOUNTER — Ambulatory Visit (INDEPENDENT_AMBULATORY_CARE_PROVIDER_SITE_OTHER): Admitting: Vascular Surgery

## 2024-01-03 ENCOUNTER — Ambulatory Visit (INDEPENDENT_AMBULATORY_CARE_PROVIDER_SITE_OTHER)

## 2024-01-03 VITALS — BP 123/61 | HR 71 | Temp 80.0°F | Ht 62.0 in | Wt 159.6 lb

## 2024-01-03 DIAGNOSIS — K219 Gastro-esophageal reflux disease without esophagitis: Secondary | ICD-10-CM | POA: Diagnosis not present

## 2024-01-03 DIAGNOSIS — I83893 Varicose veins of bilateral lower extremities with other complications: Secondary | ICD-10-CM

## 2024-01-03 DIAGNOSIS — I890 Lymphedema, not elsewhere classified: Secondary | ICD-10-CM | POA: Diagnosis not present

## 2024-01-03 DIAGNOSIS — I10 Essential (primary) hypertension: Secondary | ICD-10-CM

## 2024-01-03 NOTE — Progress Notes (Signed)
 Subjective:    Patient ID: Mia Alvarez, female    DOB: [birth year], 68 y.o.   MRN: [id number] Chief Complaint  Patient presents with   New Patient (Initial Visit)     np. BLE reflux + consult. venous insufficiency + BLE VV w/ other complications. fields, glenda. *interp requested     Mia Alvarez is a 68 yo female who presents to clinic today with chief complaint of bilateral lower extremity swelling.  Patient is Spanish-speaking only and this clinic visit was done in the presence of an official interpreter.  Patient states that she was on vacation back in July to [place] and it was very hot humid.  She noticed 1 night her bilateral lower extremities mid calf through her ankles and into her feet started to swell.  She elevated her legs and the swelling came down but not completely.  The next day she experienced swelling again and she elevated at night with some reduction in swelling.  This went on for couple of days.  When she returned to [place]  a week or so later the swelling had resolved.  In clinic on exam today she does not have any swelling to her lower extremities.  She did undergo bilateral lower extremity venous ultrasounds which did show positive reflux in each thigh.  Currently she does not have any open sores or skin breakdown to her lower extremities.  There is no discoloration to her feet.  She has started wearing compression to her lower extremities about 2 weeks ago.  She endorses this helps.  She also endorses years ago she had seen somebody for swelling in her legs in [place] and they placed her thigh-high compression stockings and that did help at that time    Review of Systems  Constitutional: Negative.   Cardiovascular:  Positive for leg swelling.  Musculoskeletal:  Positive for myalgias.  All other systems reviewed and are negative.      Objective:   Physical Exam Vitals reviewed.  Constitutional:      Appearance: Normal appearance.  She is normal weight.  HENT:     Head: Normocephalic and atraumatic.  Eyes:     Pupils: Pupils are equal, round, and reactive to light.  Cardiovascular:     Rate and Rhythm: Normal rate and regular rhythm.     Pulses: Normal pulses.     Heart sounds: Normal heart sounds.  Pulmonary:     Effort: Pulmonary effort is normal.     Breath sounds: Normal breath sounds.  Abdominal:     General: Abdomen is flat. Bowel sounds are normal.     Palpations: Abdomen is soft.  Musculoskeletal:        General: Normal range of motion.  Skin:    General: Skin is warm and dry.     Capillary Refill: Capillary refill takes 2 to 3 seconds.  Neurological:     General: No focal deficit present.     Mental Status: She is alert and oriented to person, place, and time. Mental status is at baseline.  Psychiatric:        Mood and Affect: Mood normal.        Behavior: Behavior normal.        Thought Content: Thought content normal.        Judgment: Judgment normal.     BP 123/61   Pulse 71   Temp (!) 80 F (26.7 C)   Ht 5' 2 (1.575 m)  Wt 159 lb 9.6 oz (72.4 kg)   BMI 29.19 kg/m   Past Medical History:  Diagnosis Date   Fibromyalgia    Hypertension     Social History   Socioeconomic History   Marital status: Divorced    Spouse name: Not on file   Number of children: Not on file   Years of education: Not on file   Highest education level: Not on file  Occupational History   Not on file  Tobacco Use   Smoking status: Never   Smokeless tobacco: Never  Substance and Sexual Activity   Alcohol use: No   Drug use: No   Sexual activity: Not on file  Other Topics Concern   Not on file  Social History Narrative   Not on file   Social Drivers of Health   Financial Resource Strain: Low Risk  (11/20/2023)   Received from [facility]   Overall Financial Resource Strain (CARDIA)    Difficulty of Paying Living Expenses: Not hard at all  Food Insecurity: No Food  Insecurity (11/20/2023)   Received from [facility]   Hunger Vital Sign    Within the past 12 months, you worried that your food would run out before you got the money to buy more.: Never true    Within the past 12 months, the food you bought just didn't last and you didn't have money to get more.: Never true  Transportation Needs: No Transportation Needs (11/20/2023)   Received from [facility] - Transportation    In the past 12 months, has lack of transportation kept you from medical appointments or from getting medications?: No    Lack of Transportation (Non-Medical): No  Physical Activity: Not on file  Stress: Not on file  Social Connections: Not on file  Intimate Partner Violence: Not on file    Past Surgical History:  Procedure Laterality Date   ABDOMINAL HYSTERECTOMY     BREAST BIOPSY Left 2013   neg    BREAST BIOPSY Right 12/08/2021   Stereo Bx Right breast, coil clip - BENIGN MAMMARY PARENCHYMA WITH FOCAL FIBROSIS/SCAR, FAT NECROSIS,   BREAST BIOPSY Right 02/18/2022   us  biopsy/ fat necrosis   BREAST EXCISIONAL BIOPSY Left 2013   neg   BREAST EXCISIONAL BIOPSY Right    BREAST EXCISIONAL BIOPSY Right    09/2020? per pt in [place]    Family History  Problem Relation Age of Onset   Breast cancer Sister 67   Prostate cancer Brother 73       metastatic   Breast cancer Paternal Aunt    Breast cancer Paternal Aunt    Stomach cancer Paternal Aunt    Uterine cancer Paternal Aunt    Pancreatic cancer Paternal Aunt        vs lymphoma   Prostate cancer Paternal Uncle    Prostate cancer Paternal Uncle    Lung cancer Paternal Uncle    Lung cancer Paternal Uncle     Allergies  Allergen Reactions   Guaifenesin Other (See Comments)    Bruising only with 1200 mg   Amoxicillin    Codeine Nausea And Vomiting       Latest Ref Rng & Units 11/23/2023    9:27 AM 05/04/2023   12:02 PM 05/21/2021   11:54 AM  CBC  WBC 4.0 - 10.5  K/uL 3.5  2.8  3.5   Hemoglobin 12.0 - 15.0 g/dL 86.2  15.6  14.5   Hematocrit 36.0 - 46.0 % 42.9  48.3  46.7   Platelets 150 - 400 K/uL 225  244  237       CMP     Component Value Date/Time   NA 139 05/04/2023 1202   K 3.8 05/04/2023 1202   CL 101 05/04/2023 1202   CO2 26 05/04/2023 1202   GLUCOSE 85 05/04/2023 1202   BUN 12 05/04/2023 1202   CREATININE 0.71 05/04/2023 1202   CALCIUM 9.5 05/04/2023 1202   PROT 7.7 05/04/2023 1202   ALBUMIN 4.5 05/04/2023 1202   AST 39 05/04/2023 1202   ALT 44 05/04/2023 1202   ALKPHOS 119 05/04/2023 1202   BILITOT 0.7 05/04/2023 1202   GFRNONAA >60 05/04/2023 1202     No results found.     Assessment & Plan:   1. Lymphedema (Primary) Recommend:  I have had a long discussion with the patient regarding swelling and why it  causes symptoms.  Patient will continue wearing graduated compression on a daily basis a prescription was given. The patient will  wear the stockings first thing in the morning and removing them in the evening. The patient is instructed specifically not to sleep in the stockings.   In addition, behavioral modification will be initiated.  This will include frequent elevation, use of over the counter pain medications and exercise such as walking.  Consideration for a lymph pump will also be made based upon the effectiveness of conservative therapy.  This would help to improve the edema control and prevent sequela such as ulcers and infections   The patient will follow-up with me in 6 months if needed  2. Primary hypertension Continue antihypertensive medications as already ordered, these medications have been reviewed and there are no changes at this time.  3. Gastroesophageal reflux disease without esophagitis Continue PPI as already ordered, this medication has been reviewed and there are no changes at this time.  Avoidence of caffeine and alcohol  Moderate elevation of the head of the bed    Current  Outpatient Medications on File Prior to Visit  Medication Sig Dispense Refill   amLODipine (NORVASC) 5 MG tablet Take 5 mg by mouth daily.     aspirin EC 81 MG tablet Take 81 mg by mouth daily.     loratadine (CLARITIN) 10 MG tablet Take 10 mg by mouth daily.     omeprazole (PRILOSEC) 40 MG capsule Take 40 mg by mouth daily.     amLODipine-benazepril (LOTREL) 5-10 MG capsule Take 1 capsule by mouth daily. (Patient not taking: Reported on 11/23/2023)     Cholecalciferol (VITAMIN D3) 50 MCG (2000 UT) capsule Take 1 capsule by mouth daily. (Patient not taking: Reported on 11/23/2023)     TRELEGY ELLIPTA 200-62.5-25 MCG/ACT AEPB Take 1 puff by mouth daily. (Patient not taking: Reported on 11/23/2023)     triamcinolone cream (KENALOG) 0.1 % Apply 1 Application topically 2 (two) times daily. (Patient not taking: Reported on 11/23/2023)     No current facility-administered medications on file prior to visit.    There are no Patient Instructions on file for this visit. No follow-ups on file.   Gwendlyn JONELLE Shank, NP

## 2024-01-18 ENCOUNTER — Other Ambulatory Visit: Payer: Self-pay | Admitting: Family Medicine

## 2024-01-18 DIAGNOSIS — Z1231 Encounter for screening mammogram for malignant neoplasm of breast: Secondary | ICD-10-CM

## 2024-02-09 ENCOUNTER — Ambulatory Visit
Admission: RE | Admit: 2024-02-09 | Discharge: 2024-02-09 | Disposition: A | Discharge status: Home / Self Care | Source: Ambulatory Visit | Attending: Family Medicine | Admitting: Family Medicine

## 2024-02-09 DIAGNOSIS — Z1231 Encounter for screening mammogram for malignant neoplasm of breast: Secondary | ICD-10-CM | POA: Diagnosis present

## 2024-02-29 ENCOUNTER — Ambulatory Visit: Payer: Self-pay | Admitting: General Surgery

## 2024-02-29 NOTE — H&P (View-Only) (Signed)
 History of Present Illness Mia Alvarez is a 68 year old female who presents with a lipoma on her left shoulder.  She has had a mass on her left shoulder for approximately two years. The mass has grown slightly over this period and causes discomfort, particularly when her bra strap presses against it. However, she does not experience pain.  Her mother had a similar mass on her back that grew significantly, which raises her concern about the potential for her mass to grow larger.  There is no history of prior treatments or diagnostic studies related to this condition.  She does not have family nearby but has friends who can assist her if needed.      PAST MEDICAL HISTORY:  Past Medical History:  Diagnosis Date  . Allergic rhinitis due to allergen   . Allergy   . Arthritis   . Asthma, unspecified asthma severity, unspecified whether complicated, unspecified whether persistent (HHS-HCC)   . GERD (gastroesophageal reflux disease)   . History of cataract   . History of stroke   . Hyperlipidemia   . Hypertension         PAST SURGICAL HISTORY:   Past Surgical History:  Procedure Laterality Date  . HYSTERECTOMY  1995  . HERNIA REPAIR           MEDICATIONS:  Outpatient Encounter Medications as of 02/29/2024  Medication Sig Dispense Refill  . amLODIPine (NORVASC) 5 MG tablet Take 1 tablet by mouth once daily 90 tablet 0  . diclofenac (VOLTAREN) 1 % topical gel Apply 2 g topically 4 (four) times daily 100 g 3  . diphenhydrAMINE (BENADRYL ALLERGY) 25 mg tablet Take 25 mg by mouth at bedtime as needed for Sleep or Allergies    . hydroCHLOROthiazide (HYDRODIURIL) 25 MG tablet Take 1 tablet (25 mg total) by mouth once daily 30 tablet 5  . loratadine (CLARITIN) 10 mg tablet Take 1 tablet (10 mg total) by mouth once daily 30 tablet 3  . pantoprazole (PROTONIX) 40 MG DR tablet Take 1 tablet (40 mg total) by mouth once daily 30 tablet 11  . sucralfate (CARAFATE) 1 gram tablet Take 1  tablet and mix with 5-10ml of water to make a slurry, then drink before meals 120 tablet 0  . fluticasone-umeclidinium-vilanterol (TRELEGY ELLIPTA) 200-62.5-25 mcg inhaler Inhale 1 puff by mouth once daily (Patient not taking: Reported on 02/29/2024) 180 each 1   No facility-administered encounter medications on file as of 02/29/2024.     ALLERGIES:   Amoxicillin, Codeine, and Mucinex [guaifenesin]   SOCIAL HISTORY:  Social History   Socioeconomic History  . Marital status: Legally Separated  Tobacco Use  . Smoking status: Never  . Smokeless tobacco: Never  Substance and Sexual Activity  . Alcohol use: Never  . Drug use: Never  . Sexual activity: Not Currently    Partners: Female   Social Drivers of Health   Financial Resource Strain: Low Risk  (02/14/2024)   Overall Financial Resource Strain (CARDIA)   . Difficulty of Paying Living Expenses: Not hard at all  Food Insecurity: No Food Insecurity (02/14/2024)   Hunger Vital Sign   . Worried About Programme Researcher, Broadcasting/film/video in the Last Year: Never true   . Ran Out of Food in the Last Year: Never true  Transportation Needs: No Transportation Needs (02/14/2024)   PRAPARE - Transportation   . Lack of Transportation (Medical): No   . Lack of Transportation (Non-Medical): No    FAMILY HISTORY:  Family History  Problem Relation Name Age of Onset  . High blood pressure (Hypertension) Mother Mia Alvarez   . Glaucoma Father Mia Alvarez   . Breast cancer Sister Mia Alvarez   . Prostate cancer Brother Mia Alvarez      GENERAL REVIEW OF SYSTEMS:   General ROS: negative for - chills, fatigue, fever, weight gain or weight loss Allergy and Immunology ROS: negative for - hives  Hematological and Lymphatic ROS: negative for - bleeding problems or bruising, negative for palpable nodes Endocrine ROS: negative for - heat or cold intolerance, hair changes Respiratory ROS: negative for - cough, shortness of breath or  wheezing Cardiovascular ROS: no chest pain or palpitations GI ROS: negative for nausea, vomiting, abdominal pain, diarrhea, constipation Musculoskeletal ROS: negative for - joint swelling or muscle pain Neurological ROS: negative for - confusion, syncope Dermatological ROS: negative for pruritus and rash  PHYSICAL EXAM:  Vitals:   02/29/24 1336  BP: (!) 141/79  Pulse: 82  .  Ht:152.5 cm (5' 0.04) Wt:73.9 kg (163 lb) ADJ:Anib surface area is 1.77 meters squared. Body mass index is 31.8 kg/m.SABRA   GENERAL: Alert, active, oriented x3  HEENT: Pupils equal reactive to light. Extraocular movements are intact. Sclera clear. Palpebral conjunctiva normal red color.Pharynx clear.  NECK: Supple with no palpable mass and no adenopathy.  LUNGS: Sound clear with no rales rhonchi or wheezes.  HEART: Regular rhythm S1 and S2 without murmur.  ABDOMEN: Soft and depressible, nontender with no palpable mass, no hepatomegaly.   EXTREMITIES: Well-developed well-nourished symmetrical with no dependent edema.  4.5 cm soft tissue mass in the left shoulder, mobile, OHS, rubbery.  NEUROLOGICAL: Awake alert oriented, facial expression symmetrical, moving all extremities.   Assessment & Plan Left shoulder subcutaneous mass (lipoma versus benign cyst)   A subcutaneous mass on her left shoulder has been present for approximately two years with slight growth. The differential diagnosis includes lipoma and benign cyst, both benign with no immediate malignancy concern. The mass causes discomfort, especially with bra wear, and she prefers removal due to potential growth and discomfort. Surgical removal of the mass will be scheduled under sedation in the operating room. Local anesthesia will be administered to minimize post-operative pain. A small incision will be made to excise the mass completely, with dissolvable internal sutures and a steri-stripe for external closure. Post-operative care includes ice application  for swelling and acetaminophen or ibuprofen for pain management. She must have someone accompany her home post-procedure due to sedation effects.   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   I spent a total of 46 minutes in both face-to-face and non-face-to-face activities, excluding procedures performed, for this visit on the date of this encounter.     Lucas Sjogren, MD  Electronically signed by Lucas Sjogren, MD

## 2024-03-05 ENCOUNTER — Encounter
Admission: RE | Admit: 2024-03-05 | Discharge: 2024-03-05 | Disposition: A | Discharge status: Home / Self Care | Source: Ambulatory Visit | Attending: General Surgery | Admitting: General Surgery

## 2024-03-05 ENCOUNTER — Other Ambulatory Visit: Payer: Self-pay

## 2024-03-05 VITALS — Ht 62.0 in | Wt 164.8 lb

## 2024-03-05 DIAGNOSIS — Z01810 Encounter for preprocedural cardiovascular examination: Secondary | ICD-10-CM | POA: Diagnosis not present

## 2024-03-05 DIAGNOSIS — I10 Essential (primary) hypertension: Secondary | ICD-10-CM | POA: Insufficient documentation

## 2024-03-05 DIAGNOSIS — Z01818 Encounter for other preprocedural examination: Secondary | ICD-10-CM | POA: Insufficient documentation

## 2024-03-05 HISTORY — DX: Hyperlipidemia, unspecified: E78.5

## 2024-03-05 HISTORY — DX: Cerebral infarction, unspecified: I63.9

## 2024-03-05 HISTORY — DX: Unspecified asthma, uncomplicated: J45.909

## 2024-03-05 HISTORY — DX: Gastro-esophageal reflux disease without esophagitis: K21.9

## 2024-03-05 HISTORY — DX: Unspecified osteoarthritis, unspecified site: M19.90

## 2024-03-05 HISTORY — DX: Benign lipomatous neoplasm, unspecified: D17.9

## 2024-03-05 LAB — BASIC METABOLIC PANEL WITH GFR
Anion gap: 10 (ref 5–15)
BUN: 9 mg/dL (ref 8–23)
CO2: 27 mmol/L (ref 22–32)
Calcium: 9.3 mg/dL (ref 8.9–10.3)
Chloride: 107 mmol/L (ref 98–111)
Creatinine, Ser: 0.76 mg/dL (ref 0.44–1.00)
GFR, Estimated: >60 mL/min (ref >60)
Glucose, Bld: 101 mg/dL — ABNORMAL HIGH (ref 70–99)
Potassium: 4.1 mmol/L (ref 3.5–5.1)
Sodium: 144 mmol/L (ref 135–145)

## 2024-03-05 LAB — CBC
HCT: 42.9 % (ref 36.0–46.0)
Hemoglobin: 13.9 g/dL (ref 12.0–15.0)
MCH: 26.4 pg (ref 26.0–34.0)
MCHC: 32.4 g/dL (ref 30.0–36.0)
MCV: 81.4 fL (ref 80.0–100.0)
Platelets: 214 K/uL (ref 150–400)
RBC: 5.27 MIL/uL — ABNORMAL HIGH (ref 3.87–5.11)
RDW: 13.5 % (ref 11.5–15.5)
WBC: 4.2 K/uL (ref 4.0–10.5)
nRBC: 0 % (ref 0.0–0.2)

## 2024-03-05 NOTE — Patient Instructions (Addendum)
 Your procedure is scheduled on:03/06/24 Report to the Registration Desk on the 1st floor of the [facility]. To find out your arrival time, please call [phone] between 1PM - 3PM on: 03/05/24 If your arrival time is 6:00 am, do not arrive before that time as the [facility] entrance doors do not open until 6:00 am.  REMEMBER: Instructions that are not followed completely may result in serious medical risk, up to and including death; or upon the discretion of your surgeon and anesthesiologist your surgery may need to be rescheduled.  Do not eat food after midnight the night before surgery.  No gum chewing or hard candies.  You may however, drink CLEAR liquids up to 2 hours before you are scheduled to arrive for your surgery. Do not drink anything within 2 hours of your scheduled arrival time.  Clear liquids include: - water  - apple juice without pulp - gatorade (not RED colors) - black coffee or tea (Do NOT add milk or creamers to the coffee or tea) Do NOT drink anything that is not on this list.  You may however, continue to take Tylenol if needed for pain up until the day of surgery.  Continue taking all of your other prescription medications up until the day of surgery.  ON THE DAY OF SURGERY ONLY TAKE THESE MEDICATIONS WITH SIPS OF WATER:  amLODipine (NORVASC)  pantoprazole (PROTONIX)   Use inhalers on the day of surgery and bring to the hospital.  No Alcohol for 24 hours before or after surgery.  No Smoking including e-cigarettes for 24 hours before surgery.  No chewable tobacco products for at least 6 hours before surgery.  No nicotine patches on the day of surgery.  Do not use any recreational drugs for at least a week (preferably 2 weeks) before your surgery.  Please be advised that the combination of cocaine and anesthesia may have negative outcomes, up to and including death. If you test positive for cocaine, your surgery will be cancelled.  On the morning  of surgery brush your teeth with toothpaste and water, you may rinse your mouth with mouthwash if you wish. Do not swallow any toothpaste or mouthwash.  Use CHG Soap or wipes as directed on instruction sheet.  Do not wear jewelry, make-up, hairpins, clips or nail polish.  For welded (permanent) jewelry: bracelets, anklets, waist bands, etc.  Please have this removed prior to surgery.  If it is not removed, there is a chance that hospital personnel will need to cut it off on the day of surgery.  Do not wear lotions, powders, or perfumes.   Do not shave body hair from the neck down 48 hours before surgery.  Contact lenses, hearing aids and dentures may not be worn into surgery.  Do not bring valuables to the hospital. [facility] is not responsible for any missing/lost belongings or valuables.   Notify your doctor if there is any change in your medical condition (cold, fever, infection).  Wear comfortable clothing (specific to your surgery type) to the hospital.  After surgery, you can help prevent lung complications by doing breathing exercises.  Take deep breaths and cough every 1-2 hours. Your doctor may order a device called an Incentive Spirometer to help you take deep breaths. When coughing or sneezing, hold a pillow firmly against your incision with both hands. This is called "splinting." Doing this helps protect your incision. It also decreases belly discomfort.  If you are being admitted to the hospital overnight,  leave your suitcase in the car. After surgery it may be brought to your room.  In case of increased patient census, it may be necessary for you, the patient, to continue your postoperative care in the Same Day Surgery department.  If you are being discharged the day of surgery, you will not be allowed to drive home. You will need a responsible individual to drive you home and stay with you for 24 hours after surgery.   If you are taking public transportation, you will  need to have a responsible individual with you.  Please call the Pre-admissions Testing Dept. at [phone] if you have any questions about these instructions.  Surgery Visitation Policy:  Patients having surgery or a procedure may have two visitors.  Children under the age of 29 must have an adult with them who is not the patient.  Merchandiser, Retail to address health-related social needs:  [email or web address]                                                                     Preparacin para la ciruga con jabn de GLUCONATO DE CLORHEXIDINA (CHG)  Jabn de gluconato de clorhexidina (CHG)  * Un limpiador antisptico que [employer] grmenes y se adhiere a la piel para seguir [employer] grmenes incluso despus del lavado.   *Se utiliza para ducharse la noche anterior a la ciruga y la maana de la ciruga.  Antes de la ciruga, usted puede desempear un papel importante al reducir la cantidad de grmenes en su piel. El jabn CHG (gluconato de clorhexidina) es un limpiador antisptico que mata los grmenes y se adhiere a la piel para continuar matndolos incluso despus del lavado.  No lo utilice si es alrgico al CHG o a los jabones antibacterianos. Si su piel se enrojece o irrita, deje de usar CHG.  1. Ducharse la NOCHE ANTES DE LA CIRUGA y la [place] DE LA CIRUGA con jabn CHG.  2. Si eliges lavarte el cabello, lvalo primero como de costumbre con tu champ habitual.  3. Despus del champ, enjuague bien el cabello y el cuerpo para eliminar el champ.  4. Utilice CHG como lo hara con cualquier otro jabn lquido. Puede aplicar CHG directamente sobre la piel y lavar suavemente con un pauelo o una toallita limpia.  5. Aplique el jabn CHG en su cuerpo nicamente desde el cuello hacia abajo. No utilizar en heridas abiertas o llagas abiertas. Evite el contacto con los ojos, odos, boca y genitales (partes privadas). Lvese la cara y los genitales (partes  privadas) con su jabn habitual.  6. Lvese bien, prestando especial atencin al rea donde se realizar su ciruga.  7. Enjuague bien su cuerpo con agua tibia.  8. No se duche ni se lave con su jabn normal despus de usar y enjuagar el jabn CHG.  9. Squese dando palmaditas con una toalla limpia.  10. Use pijamas limpios para dormir la noche anterior a la ciruga.  12. Coloque sbanas limpias en su cama la noche de su primera ducha y no duerma con mascotas.  13. Ducharse nuevamente con el jabn CHG el da de la ciruga antes de llegar al hospital.  14. No aplique desodorantes, lociones o polvos.  15. Por favor  use ropa limpia al hospital.

## 2024-03-06 ENCOUNTER — Encounter
Admission: RE | Disposition: A | Discharge status: Home / Self Care | Payer: Self-pay | Source: Home / Self Care | Attending: General Surgery

## 2024-03-06 ENCOUNTER — Ambulatory Visit
Admission: RE | Admit: 2024-03-06 | Discharge: 2024-03-06 | Disposition: A | Discharge status: Home / Self Care | Attending: General Surgery | Admitting: General Surgery

## 2024-03-06 ENCOUNTER — Ambulatory Visit: Admitting: Certified Registered"

## 2024-03-06 ENCOUNTER — Other Ambulatory Visit: Payer: Self-pay

## 2024-03-06 ENCOUNTER — Encounter: Payer: Self-pay | Admitting: General Surgery

## 2024-03-06 DIAGNOSIS — M1990 Unspecified osteoarthritis, unspecified site: Secondary | ICD-10-CM | POA: Insufficient documentation

## 2024-03-06 DIAGNOSIS — Z8673 Personal history of transient ischemic attack (TIA), and cerebral infarction without residual deficits: Secondary | ICD-10-CM | POA: Diagnosis not present

## 2024-03-06 DIAGNOSIS — I10 Essential (primary) hypertension: Secondary | ICD-10-CM | POA: Insufficient documentation

## 2024-03-06 DIAGNOSIS — D1722 Benign lipomatous neoplasm of skin and subcutaneous tissue of left arm: Secondary | ICD-10-CM | POA: Insufficient documentation

## 2024-03-06 DIAGNOSIS — K219 Gastro-esophageal reflux disease without esophagitis: Secondary | ICD-10-CM | POA: Diagnosis not present

## 2024-03-06 DIAGNOSIS — J45909 Unspecified asthma, uncomplicated: Secondary | ICD-10-CM | POA: Diagnosis not present

## 2024-03-06 DIAGNOSIS — Z79899 Other long term (current) drug therapy: Secondary | ICD-10-CM | POA: Diagnosis not present

## 2024-03-06 DIAGNOSIS — Z791 Long term (current) use of non-steroidal anti-inflammatories (NSAID): Secondary | ICD-10-CM | POA: Diagnosis not present

## 2024-03-06 HISTORY — DX: Failed or difficult intubation, initial encounter: T88.4XXA

## 2024-03-06 HISTORY — PX: EXCISION, MASS, UPPER EXTREMITY: SHX7567

## 2024-03-06 SURGERY — EXCISION, MASS, UPPER EXTREMITY
Anesthesia: General | Site: Shoulder | Laterality: Left

## 2024-03-06 MED ORDER — MIDAZOLAM HCL 2 MG/2ML IJ SOLN
INTRAMUSCULAR | Status: AC
  Filled 2024-03-06: qty 2

## 2024-03-06 MED ORDER — FENTANYL CITRATE (PF) 100 MCG/2ML IJ SOLN
INTRAMUSCULAR | Status: AC
  Filled 2024-03-06: qty 2

## 2024-03-06 MED ORDER — FENTANYL CITRATE (PF) 100 MCG/2ML IJ SOLN: 25.0000 ug | INTRAMUSCULAR | Status: DC | PRN

## 2024-03-06 MED ORDER — CEFAZOLIN SODIUM-DEXTROSE 2-4 GM/100ML-% IV SOLN
2.0000 g | INTRAVENOUS | Status: AC
  Administered 2024-03-06: 2 g via INTRAVENOUS

## 2024-03-06 MED ORDER — SUGAMMADEX SODIUM 200 MG/2ML IV SOLN
INTRAVENOUS | Status: DC | PRN
  Administered 2024-03-06: 200 mg via INTRAVENOUS

## 2024-03-06 MED ORDER — ROCURONIUM BROMIDE 100 MG/10ML IV SOLN
INTRAVENOUS | Status: DC | PRN
  Administered 2024-03-06: 50 mg via INTRAVENOUS

## 2024-03-06 MED ORDER — ONDANSETRON HCL 4 MG/2ML IJ SOLN
INTRAMUSCULAR | Status: DC | PRN
  Administered 2024-03-06: 4 mg via INTRAVENOUS

## 2024-03-06 MED ORDER — KETAMINE HCL 50 MG/5ML IJ SOSY
PREFILLED_SYRINGE | INTRAMUSCULAR | Status: AC
  Filled 2024-03-06: qty 5

## 2024-03-06 MED ORDER — EPHEDRINE SULFATE-NACL 50-0.9 MG/10ML-% IV SOSY
PREFILLED_SYRINGE | INTRAVENOUS | Status: DC | PRN
  Administered 2024-03-06: 5 mg via INTRAVENOUS

## 2024-03-06 MED ORDER — HYDROCODONE-ACETAMINOPHEN 5-325 MG PO TABS
1.0000 | ORAL_TABLET | Freq: Four times a day (QID) | ORAL | 0 refills | Status: AC | PRN
  Filled 2024-03-06: qty 10, 3d supply, fill #0

## 2024-03-06 MED ORDER — ACETAMINOPHEN 10 MG/ML IV SOLN
INTRAVENOUS | Status: DC | PRN
  Administered 2024-03-06: 1000 mg via INTRAVENOUS

## 2024-03-06 MED ORDER — FENTANYL CITRATE (PF) 100 MCG/2ML IJ SOLN
INTRAMUSCULAR | Status: DC | PRN
  Administered 2024-03-06 (×2): 50 ug via INTRAVENOUS

## 2024-03-06 MED ORDER — OXYCODONE HCL 5 MG/5ML PO SOLN: 5.0000 mg | Freq: Once | ORAL | Status: DC | PRN

## 2024-03-06 MED ORDER — PROPOFOL 10 MG/ML IV BOLUS
INTRAVENOUS | Status: DC | PRN
  Administered 2024-03-06: 200 mg via INTRAVENOUS

## 2024-03-06 MED ORDER — 0.9 % SODIUM CHLORIDE (POUR BTL) OPTIME
TOPICAL | Status: DC | PRN
  Administered 2024-03-06: 500 mL

## 2024-03-06 MED ORDER — DEXMEDETOMIDINE HCL IN NACL 200 MCG/50ML IV SOLN
INTRAVENOUS | Status: DC | PRN
  Administered 2024-03-06: 12 ug via INTRAVENOUS
  Administered 2024-03-06: 8 ug via INTRAVENOUS

## 2024-03-06 MED ORDER — LACTATED RINGERS IV SOLN: INTRAVENOUS | Status: DC

## 2024-03-06 MED ORDER — SUCCINYLCHOLINE CHLORIDE 200 MG/10ML IV SOSY
PREFILLED_SYRINGE | INTRAVENOUS | Status: DC | PRN
  Administered 2024-03-06: 100 mg via INTRAVENOUS

## 2024-03-06 MED ORDER — GLYCOPYRROLATE 0.2 MG/ML IJ SOLN
INTRAMUSCULAR | Status: DC | PRN
  Administered 2024-03-06: .2 mg via INTRAVENOUS

## 2024-03-06 MED ORDER — MIDAZOLAM HCL (PF) 2 MG/2ML IJ SOLN
INTRAMUSCULAR | Status: DC | PRN
  Administered 2024-03-06: 2 mg via INTRAVENOUS

## 2024-03-06 MED ORDER — CHLORHEXIDINE GLUCONATE 0.12 % MT SOLN
OROMUCOSAL | Status: AC
  Filled 2024-03-06: qty 15

## 2024-03-06 MED ORDER — ACETAMINOPHEN 10 MG/ML IV SOLN
INTRAVENOUS | Status: AC
  Filled 2024-03-06: qty 100

## 2024-03-06 MED ORDER — CEFAZOLIN SODIUM-DEXTROSE 2-4 GM/100ML-% IV SOLN
INTRAVENOUS | Status: AC
  Filled 2024-03-06: qty 100

## 2024-03-06 MED ORDER — CHLORHEXIDINE GLUCONATE 0.12 % MT SOLN
15.0000 mL | Freq: Once | OROMUCOSAL | Status: AC
  Administered 2024-03-06: 15 mL via OROMUCOSAL

## 2024-03-06 MED ORDER — ORAL CARE MOUTH RINSE: 15.0000 mL | Freq: Once | OROMUCOSAL | Status: AC

## 2024-03-06 MED ORDER — OXYCODONE HCL 5 MG PO TABS: 5.0000 mg | ORAL_TABLET | Freq: Once | ORAL | Status: DC | PRN

## 2024-03-06 MED ORDER — LIDOCAINE HCL (CARDIAC) PF 100 MG/5ML IV SOSY
PREFILLED_SYRINGE | INTRAVENOUS | Status: DC | PRN
  Administered 2024-03-06: 100 mg via INTRAVENOUS

## 2024-03-06 MED ORDER — BUPIVACAINE-EPINEPHRINE 0.5% -1:200000 IJ SOLN
INTRAMUSCULAR | Status: DC | PRN
  Administered 2024-03-06: 3 mL

## 2024-03-06 MED ORDER — BUPIVACAINE-EPINEPHRINE (PF) 0.5% -1:200000 IJ SOLN
INTRAMUSCULAR | Status: AC
  Filled 2024-03-06: qty 30

## 2024-03-06 MED ORDER — PHENYLEPHRINE 80 MCG/ML (10ML) SYRINGE FOR IV PUSH (FOR BLOOD PRESSURE SUPPORT)
PREFILLED_SYRINGE | INTRAVENOUS | Status: DC | PRN
  Administered 2024-03-06: 160 ug via INTRAVENOUS

## 2024-03-06 MED ORDER — DEXAMETHASONE SOD PHOSPHATE PF 10 MG/ML IJ SOLN
INTRAMUSCULAR | Status: DC | PRN
  Administered 2024-03-06: 20 mg via INTRAVENOUS

## 2024-03-06 SURGICAL SUPPLY — 21 items
CHLORAPREP W/TINT 26 (MISCELLANEOUS) IMPLANT
DERMABOND ADVANCED .7 DNX12 (GAUZE/BANDAGES/DRESSINGS) ×1 IMPLANT
DRAPE LAPAROTOMY 100X77 ABD (DRAPES) ×1 IMPLANT
ELECTRODE REM PT RTRN 9FT ADLT (ELECTROSURGICAL) ×1 IMPLANT
GLOVE BIO SURGEON STRL SZ 6.5 (GLOVE) ×1 IMPLANT
GLOVE BIOGEL PI IND STRL 6.5 (GLOVE) ×1 IMPLANT
GLOVE SURG SYN 6.5 PF PI (GLOVE) ×2 IMPLANT
GOWN STRL REUS W/ TWL LRG LVL3 (GOWN DISPOSABLE) ×3 IMPLANT
KIT TURNOVER KIT A (KITS) ×1 IMPLANT
LABEL OR SOLS (LABEL) ×1 IMPLANT
MANIFOLD NEPTUNE II (INSTRUMENTS) ×1 IMPLANT
NDL HYPO 25X1 1.5 SAFETY (NEEDLE) ×1 IMPLANT
NEEDLE HYPO 25X1 1.5 SAFETY (NEEDLE) ×1 IMPLANT
NS IRRIG 500ML POUR BTL (IV SOLUTION) ×1 IMPLANT
PACK BASIN MINOR ARMC (MISCELLANEOUS) ×1 IMPLANT
SUT ETHILON 3-0 (SUTURE) IMPLANT
SUT VIC AB 3-0 SH 27X BRD (SUTURE) ×1 IMPLANT
SUTURE MNCRL 4-0 27XMF (SUTURE) ×1 IMPLANT
SYR 10ML LL (SYRINGE) ×1 IMPLANT
TRAP FLUID SMOKE EVACUATOR (MISCELLANEOUS) ×1 IMPLANT
WATER STERILE IRR 500ML POUR (IV SOLUTION) ×1 IMPLANT

## 2024-03-06 NOTE — Interval H&P Note (Signed)
 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  The various methods of treatment have been discussed with the patient and family. After consideration of risks, benefits and other options for treatment, the patient has consented to  Procedure(s): EXCISION, MASS, UPPER EXTREMITY (Left) as a surgical intervention.  The patient's history has been reviewed, patient examined, no change in status, stable for surgery.  I have reviewed the patient's chart and labs.  Questions were answered to the patient's satisfaction.     Lucas Sjogren

## 2024-03-06 NOTE — Op Note (Signed)
 OPERATION REPORT  Pre Operative Diagnosis: Left shoulder lipoma  Post operative diagnosis: Same  Anesthesia: General and Local   Surgeon: Dr. Rodolph   Indication: This 68 y.o. year old female with a soft tissue mass on the left shoulder that is causing discomfort with pressure and increasing in size.    Description of procedure: after orienting patient about the procedure steps and benefits and patient agreed to proceed. Time out was done identifying correct patient and location of procedure. After induction of general anesthesia, local anesthesia was infiltrated around the palpable lesion. With a blade #15, an elliptical incision was made using the skin lines. Sharp dissection was carried down and lesion was excised including dermal tissue. The mass measured 4.1 cm in vivo. Deep dermal stitches were done with vicryl 4-0 to repair the laceration and skin closed with Monocryl 4-0 in subcuticular fashion. Specimen sent to pathology.    Complications: none   EBL: minimal  Lucas Rodolph, MD, FACS

## 2024-03-06 NOTE — Transfer of Care (Signed)
 Immediate Anesthesia Transfer of Care Note  Patient: Mia Alvarez  Procedure(s) Performed: EXCISION, MASS, UPPER EXTREMITY (Left: Shoulder)  Patient Location: PACU  Anesthesia Type:General  Level of Consciousness: awake, drowsy, and patient cooperative  Airway & Oxygen Therapy: Patient Spontanous Breathing and Patient connected to face mask oxygen  Post-op Assessment: Report given to RN and Post -op Vital signs reviewed and stable  Post vital signs: Reviewed and stable  Last Vitals:  Vitals Value Taken Time  BP    Temp    Pulse    Resp    SpO2      Last Pain: There were no vitals filed for this visit.       Complications: No notable events documented.

## 2024-03-06 NOTE — Anesthesia Procedure Notes (Signed)
 Procedure Name: Intubation Date/Time: 03/06/2024 12:21 PM  Performed by: Ledora Duncan, CRNAPre-anesthesia Checklist: Patient identified, Emergency Drugs available, Suction available and Patient being monitored Patient Re-evaluated:Patient Re-evaluated prior to induction Oxygen Delivery Method: Circle system utilized Preoxygenation: Pre-oxygenation with 100% oxygen Induction Type: IV induction and Cricoid Pressure applied Ventilation: Mask ventilation without difficulty Laryngoscope Size: McGrath and 3 Grade View: Grade III Tube type: Oral Number of attempts: 1 Airway Equipment and Method: Stylet and Video-laryngoscopy Placement Confirmation: ETT inserted through vocal cords under direct vision, positive ETCO2 and breath sounds checked- equal and bilateral Secured at: 21 cm Tube secured with: Tape Dental Injury: Teeth and Oropharynx as per pre-operative assessment  Difficulty Due To: Difficulty was unanticipated and Difficult Airway- due to anterior larynx Future Recommendations: Recommend- induction with short-acting agent, and alternative techniques readily available Comments: Atraumatic TFH CRNA-please see note

## 2024-03-06 NOTE — Anesthesia Preprocedure Evaluation (Addendum)
 Anesthesia Evaluation  Patient identified by MRN, date of birth, ID band Patient awake    Reviewed: Allergy & Precautions, NPO status , Patient's Chart, lab work & pertinent test results  History of Anesthesia Complications Negative for: history of anesthetic complications  Airway Mallampati: III  TM Distance: <3 FB Neck ROM: full    Dental  (+) Chipped, Poor Dentition, Missing   Pulmonary neg shortness of breath, asthma    Pulmonary exam normal        Cardiovascular Exercise Tolerance: Good hypertension, (-) angina Normal cardiovascular exam     Neuro/Psych  Neuromuscular disease CVA  negative psych ROS   GI/Hepatic Neg liver ROS,GERD  Controlled,,  Endo/Other  negative endocrine ROS    Renal/GU      Musculoskeletal   Abdominal   Peds  Hematology negative hematology ROS (+)   Anesthesia Other Findings Past Medical History: No date: Arthritis No date: Asthma No date: Fibromyalgia No date: GERD (gastroesophageal reflux disease) No date: HLD (hyperlipidemia) No date: Hypertension No date: Lipoma     Comment:  left shoulder No date: Stroke [facility])  Past Surgical History: No date: ABDOMINAL HYSTERECTOMY 2013: BREAST BIOPSY; Left     Comment:  neg  12/08/2021: BREAST BIOPSY; Right     Comment:  Stereo Bx Right breast, coil clip - BENIGN MAMMARY               PARENCHYMA WITH FOCAL FIBROSIS/SCAR, FAT NECROSIS, 02/18/2022: BREAST BIOPSY; Right     Comment:  us  biopsy/ fat necrosis 2013: BREAST EXCISIONAL BIOPSY; Left     Comment:  neg No date: BREAST EXCISIONAL BIOPSY; Right No date: BREAST EXCISIONAL BIOPSY; Right     Comment:  09/2020? per pt in [place]     Reproductive/Obstetrics negative OB ROS                              Anesthesia Physical Anesthesia Plan  ASA: 3  Anesthesia Plan: General ETT   Post-op Pain Management:    Induction: Intravenous  PONV Risk Score  and Plan: Ondansetron, Dexamethasone , Midazolam and Treatment may vary due to age or medical condition  Airway Management Planned: Oral ETT  Additional Equipment:   Intra-op Plan:   Post-operative Plan: Extubation in OR  Informed Consent: I have reviewed the patients History and Physical, chart, labs and discussed the procedure including the risks, benefits and alternatives for the proposed anesthesia with the patient or authorized representative who has indicated his/her understanding and acceptance.     Dental Advisory Given and Interpreter used for interview  Plan Discussed with: Anesthesiologist, CRNA and Surgeon  Anesthesia Plan Comments: (Patient consented for risks of anesthesia including but not limited to:  - adverse reactions to medications - damage to eyes, teeth, lips or other oral mucosa - nerve damage due to positioning  - sore throat or hoarseness - Damage to heart, brain, nerves, lungs, other parts of body or loss of life  Patient voiced understanding and assent.)        Anesthesia Quick Evaluation

## 2024-03-06 NOTE — Discharge Instructions (Addendum)
   Diet: Resume home heart healthy regular diet.   Activity: Increase activity as tolerated. Light activity and walking are encouraged. Do not drive or drink alcohol if taking narcotic pain medications.  Wound care: May shower with soapy water and pat dry (do not rub incisions), but no baths or submerging incision underwater until follow-up. (no swimming)   Medications: Resume all home medications. For mild to moderate pain: acetaminophen (Tylenol) or ibuprofen (if no kidney disease). Combining Tylenol with alcohol can substantially increase your risk of causing liver disease. Narcotic pain medications, if prescribed, can be used for severe pain, though may cause nausea, constipation, and drowsiness. Do not combine Tylenol and Norco within a 6 hour period as Norco contains Tylenol. If you do not need the narcotic pain medication, you do not need to fill the prescription.  Call office [phone]) at any time if any questions, worsening pain, fevers/chills, bleeding, drainage from incision site, or other concerns.

## 2024-03-07 ENCOUNTER — Encounter: Payer: Self-pay | Admitting: General Surgery

## 2024-03-07 NOTE — Congregational Nurse Program (Signed)
  Dept: [phone]   Congregational Nurse Program Note  Date of Encounter: 03/07/2024  Clinic visit to check blood pressure, recent surgery left shoulder to remove a lipoma.  Has 3 sutures, no drainage, bleeding or edema noted on dressing.  States no pain with taking Ibuprofen. Past Medical History: Past Medical History:  Diagnosis Date   Arthritis    Asthma    Difficult intubation    Fibromyalgia    GERD (gastroesophageal reflux disease)    HLD (hyperlipidemia)    Hypertension    Lipoma    left shoulder   Stroke [facility])     Encounter Details:  Community Questionnaire - 03/07/24 1625       Questionnaire   Ask client: Do you give verbal consent for me to treat you today? Yes    Student Assistance [employer] Nurse    Location Patient Served  N/A    Encounter Setting CN site    Population Status Unknown    Insurance Medicaid;Medicare    Insurance/Financial Assistance Referral N/A    Medication N/A    Medical Provider Yes    Screening Referrals Made N/A    Medical Referrals Made N/A    Medical Appointment Completed N/A    CNP Interventions Advocate/Support;Counsel    Screenings CN Performed Blood Pressure    ED Visit Averted N/A    Life-Saving Intervention Made N/A

## 2024-03-07 NOTE — Anesthesia Postprocedure Evaluation (Signed)
 Anesthesia Post Note  Patient: Mia Alvarez  Procedure(s) Performed: EXCISION, MASS, UPPER EXTREMITY (Left: Shoulder)  Patient location during evaluation: PACU Anesthesia Type: General Level of consciousness: awake and alert Pain management: pain level controlled Vital Signs Assessment: post-procedure vital signs reviewed and stable Respiratory status: spontaneous breathing, nonlabored ventilation and respiratory function stable Cardiovascular status: blood pressure returned to baseline and stable Postop Assessment: no apparent nausea or vomiting Anesthetic complications: no   No notable events documented.   Last Vitals:  Vitals:   03/06/24 1345 03/06/24 1402  BP: 107/71 120/72  Pulse: 77 72  Resp:  18  Temp: 36.5 C (!) 36.1 C  SpO2: 94% 96%    Last Pain:  Vitals:   03/06/24 1402  TempSrc: Temporal  PainSc: 0-No pain                 Fairy POUR Reshad Saab

## 2024-03-08 LAB — SURGICAL PATHOLOGY

## 2024-03-12 ENCOUNTER — Encounter: Payer: Self-pay | Admitting: General Surgery

## 2024-04-04 NOTE — Congregational Nurse Program (Signed)
 Dept: [phone]   Congregational Nurse Program Note  Date of Encounter: 04/04/2024  Past Medical History: Past Medical History:  Diagnosis Date   Arthritis    Asthma    Difficult intubation    Fibromyalgia    GERD (gastroesophageal reflux disease)    HLD (hyperlipidemia)    Hypertension    Lipoma    left shoulder   Stroke [facility])     Encounter Details:  Community Questionnaire - 04/04/24 1620       Questionnaire   Ask client: Do you give verbal consent for me to treat you today? Yes    Student Assistance N/A    Location Patient Served  N/A    Encounter Setting CN site    Population Status Unknown    Insurance Medicaid;Medicare    Insurance/Financial Assistance Referral N/A    Medication N/A    Medical Provider Yes    Screening Referrals Made N/A    Medical Referrals Made N/A    Medical Appointment Completed N/A    CNP Interventions Advocate/Support;Counsel;Educate    Screenings CN Performed Blood Pressure    ED Visit Averted N/A    Life-Saving Intervention Made N/A            Dept: [phone]   Congregational Nurse Program Note  Date of Encounter: 04/04/2024  Clinic visit to check blood pressure, BP 118/76, pulse 75 and regular.  States she take all her medicines, discussed preventing complications from hypertension.  Past Medical History: Past Medical History:  Diagnosis Date   Arthritis    Asthma    Difficult intubation    Fibromyalgia    GERD (gastroesophageal reflux disease)    HLD (hyperlipidemia)    Hypertension    Lipoma    left shoulder   Stroke [facility])     Encounter Details:  Community Questionnaire - 04/04/24 1620       Questionnaire   Ask client: Do you give verbal consent for me to treat you today? Yes    Student Assistance N/A    Location Patient Served  N/A    Encounter Setting CN site    Population Status Unknown    Insurance Medicaid;Medicare    Insurance/Financial Assistance Referral N/A    Medication N/A     Medical Provider Yes    Screening Referrals Made N/A    Medical Referrals Made N/A    Medical Appointment Completed N/A    CNP Interventions Advocate/Support;Counsel;Educate    Screenings CN Performed Blood Pressure    ED Visit Averted N/A    Life-Saving Intervention Made N/A            Dept: [phone]   Congregational Nurse Program Note  Date of Encounter: 04/04/2024  Clinic visit to check blood pressure, states she is taking her medications daily.  BP 118/76, pulse 75 and regular, discussed preventing complications from high blood pressure. Past Medical History: Past Medical History:  Diagnosis Date   Arthritis    Asthma    Difficult intubation    Fibromyalgia    GERD (gastroesophageal reflux disease)    HLD (hyperlipidemia)    Hypertension    Lipoma    left shoulder   Stroke [facility])     Encounter Details:  Community Questionnaire - 04/04/24 1620       Questionnaire   Ask client: Do you give verbal consent for me to treat you today? Yes    Student Assistance N/A    Location Patient Served  N/A    Encounter  Setting CN site    Population Status Unknown    Insurance Medicaid;Medicare    Insurance/Financial Assistance Referral N/A    Medication N/A    Medical Provider Yes    Screening Referrals Made N/A    Medical Referrals Made N/A    Medical Appointment Completed N/A    CNP Interventions Advocate/Support;Counsel;Educate    Screenings CN Performed Blood Pressure    ED Visit Averted N/A    Life-Saving Intervention Made N/A

## 2024-07-02 ENCOUNTER — Ambulatory Visit (INDEPENDENT_AMBULATORY_CARE_PROVIDER_SITE_OTHER): Admitting: Nurse Practitioner

## 2024-07-02 ENCOUNTER — Ambulatory Visit (INDEPENDENT_AMBULATORY_CARE_PROVIDER_SITE_OTHER): Admitting: Vascular Surgery
